# Patient Record
Sex: Female | Born: 1960 | Race: White | Hispanic: No | Smoking: Never smoker
Health system: Southern US, Community
[De-identification: ages and names within clinical notes are randomized; demographics above are authoritative.]

## PROBLEM LIST (undated history)

## (undated) DIAGNOSIS — K5790 Diverticulosis of intestine, part unspecified, without perforation or abscess without bleeding: Secondary | ICD-10-CM

## (undated) DIAGNOSIS — E240 Pituitary-dependent Cushing's disease: Secondary | ICD-10-CM

## (undated) DIAGNOSIS — K644 Residual hemorrhoidal skin tags: Secondary | ICD-10-CM

## (undated) DIAGNOSIS — K648 Other hemorrhoids: Secondary | ICD-10-CM

## (undated) DIAGNOSIS — G43909 Migraine, unspecified, not intractable, without status migrainosus: Secondary | ICD-10-CM

## (undated) DIAGNOSIS — K594 Anal spasm: Secondary | ICD-10-CM

## (undated) DIAGNOSIS — Z9889 Other specified postprocedural states: Secondary | ICD-10-CM

## (undated) DIAGNOSIS — R519 Headache, unspecified: Secondary | ICD-10-CM

## (undated) DIAGNOSIS — E785 Hyperlipidemia, unspecified: Secondary | ICD-10-CM

## (undated) DIAGNOSIS — M8580 Other specified disorders of bone density and structure, unspecified site: Secondary | ICD-10-CM

## (undated) DIAGNOSIS — T4145XA Adverse effect of unspecified anesthetic, initial encounter: Secondary | ICD-10-CM

## (undated) DIAGNOSIS — K219 Gastro-esophageal reflux disease without esophagitis: Secondary | ICD-10-CM

## (undated) DIAGNOSIS — R51 Headache: Secondary | ICD-10-CM

## (undated) DIAGNOSIS — T7840XA Allergy, unspecified, initial encounter: Secondary | ICD-10-CM

## (undated) DIAGNOSIS — K589 Irritable bowel syndrome without diarrhea: Secondary | ICD-10-CM

## (undated) DIAGNOSIS — I341 Nonrheumatic mitral (valve) prolapse: Secondary | ICD-10-CM

## (undated) DIAGNOSIS — R011 Cardiac murmur, unspecified: Secondary | ICD-10-CM

## (undated) DIAGNOSIS — F419 Anxiety disorder, unspecified: Secondary | ICD-10-CM

## (undated) DIAGNOSIS — Z86010 Personal history of colonic polyps: Secondary | ICD-10-CM

## (undated) DIAGNOSIS — R112 Nausea with vomiting, unspecified: Secondary | ICD-10-CM

## (undated) DIAGNOSIS — N200 Calculus of kidney: Secondary | ICD-10-CM

## (undated) DIAGNOSIS — T8859XA Other complications of anesthesia, initial encounter: Secondary | ICD-10-CM

## (undated) HISTORY — DX: Other hemorrhoids: K64.8

## (undated) HISTORY — PX: LITHOTRIPSY: SUR834

## (undated) HISTORY — DX: Hyperlipidemia, unspecified: E78.5

## (undated) HISTORY — DX: Anal spasm: K59.4

## (undated) HISTORY — DX: Residual hemorrhoidal skin tags: K64.4

## (undated) HISTORY — DX: Irritable bowel syndrome, unspecified: K58.9

## (undated) HISTORY — DX: Personal history of colonic polyps: Z86.010

## (undated) HISTORY — DX: Migraine, unspecified, not intractable, without status migrainosus: G43.909

## (undated) HISTORY — DX: Diverticulosis of intestine, part unspecified, without perforation or abscess without bleeding: K57.90

## (undated) HISTORY — DX: Other specified disorders of bone density and structure, unspecified site: M85.80

## (undated) HISTORY — PX: BREAST SURGERY: SHX581

## (undated) HISTORY — PX: ESOPHAGOGASTRODUODENOSCOPY: SHX1529

## (undated) HISTORY — DX: Allergy, unspecified, initial encounter: T78.40XA

---
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
## 1987-02-01 HISTORY — PX: OTHER SURGICAL HISTORY: SHX169

## 1987-02-01 HISTORY — PX: LAPAROSCOPIC ENDOMETRIOSIS FULGURATION: SUR769

## 2000-01-21 ENCOUNTER — Ambulatory Visit (HOSPITAL_COMMUNITY): Admission: RE | Admit: 2000-01-21 | Discharge: 2000-01-21 | Payer: Self-pay | Admitting: Endocrinology

## 2000-01-21 ENCOUNTER — Encounter: Payer: Self-pay | Admitting: Endocrinology

## 2001-12-17 ENCOUNTER — Other Ambulatory Visit: Admission: RE | Admit: 2001-12-17 | Discharge: 2001-12-17 | Payer: Self-pay | Admitting: Gynecology

## 2003-01-03 ENCOUNTER — Other Ambulatory Visit: Admission: RE | Admit: 2003-01-03 | Discharge: 2003-01-03 | Payer: Self-pay | Admitting: Gynecology

## 2004-03-31 ENCOUNTER — Other Ambulatory Visit: Admission: RE | Admit: 2004-03-31 | Discharge: 2004-03-31 | Payer: Self-pay | Admitting: Gynecology

## 2004-05-07 ENCOUNTER — Encounter: Admission: RE | Admit: 2004-05-07 | Discharge: 2004-05-07 | Payer: Self-pay | Admitting: Endocrinology

## 2005-12-19 ENCOUNTER — Other Ambulatory Visit: Admission: RE | Admit: 2005-12-19 | Discharge: 2005-12-19 | Payer: Self-pay | Admitting: Gynecology

## 2006-12-21 ENCOUNTER — Other Ambulatory Visit: Admission: RE | Admit: 2006-12-21 | Discharge: 2006-12-21 | Payer: Self-pay | Admitting: Gynecology

## 2007-12-31 ENCOUNTER — Other Ambulatory Visit: Admission: RE | Admit: 2007-12-31 | Discharge: 2007-12-31 | Payer: Self-pay | Admitting: Gynecology

## 2008-09-22 ENCOUNTER — Ambulatory Visit: Payer: Self-pay | Admitting: Radiology

## 2008-09-22 ENCOUNTER — Encounter: Payer: Self-pay | Admitting: Emergency Medicine

## 2008-09-22 ENCOUNTER — Inpatient Hospital Stay (HOSPITAL_COMMUNITY): Admission: AD | Admit: 2008-09-22 | Discharge: 2008-09-23 | Payer: Self-pay | Admitting: Internal Medicine

## 2009-12-28 ENCOUNTER — Emergency Department (HOSPITAL_BASED_OUTPATIENT_CLINIC_OR_DEPARTMENT_OTHER): Admission: EM | Admit: 2009-12-28 | Discharge: 2009-12-28 | Payer: Self-pay | Admitting: Emergency Medicine

## 2010-05-08 LAB — BASIC METABOLIC PANEL
BUN: 11 mg/dL (ref 6–23)
BUN: 13 mg/dL (ref 6–23)
BUN: 17 mg/dL (ref 6–23)
CO2: 29 mEq/L (ref 19–32)
Calcium: 9.9 mg/dL (ref 8.4–10.5)
Chloride: 105 mEq/L (ref 96–112)
Chloride: 106 mEq/L (ref 96–112)
Creatinine, Ser: 0.79 mg/dL (ref 0.4–1.2)
Creatinine, Ser: 0.8 mg/dL (ref 0.4–1.2)
GFR calc Af Amer: >60 mL/min (ref >60)
GFR calc Af Amer: >60 mL/min (ref >60)
GFR calc non Af Amer: >60 mL/min (ref >60)
GFR calc non Af Amer: >60 mL/min (ref >60)
Glucose, Bld: 84 mg/dL (ref 70–99)
Potassium: 3.2 mEq/L — ABNORMAL LOW (ref 3.5–5.1)
Potassium: 3.8 mEq/L (ref 3.5–5.1)
Sodium: 141 mEq/L (ref 135–145)
Sodium: 142 mEq/L (ref 135–145)

## 2010-05-08 LAB — CBC
HCT: 43.5 % (ref 36.0–46.0)
Hemoglobin: 14.9 g/dL (ref 12.0–15.0)
MCHC: 34.2 g/dL (ref 30.0–36.0)
MCV: 95.6 fL (ref 78.0–100.0)
Platelets: 265 10*3/uL (ref 150–400)
RBC: 4.55 MIL/uL (ref 3.87–5.11)
RDW: 12.7 % (ref 11.5–15.5)
WBC: 5.4 10*3/uL (ref 4.0–10.5)

## 2010-05-08 LAB — DIFFERENTIAL
Basophils Absolute: 0 10*3/uL (ref 0.0–0.1)
Basophils Relative: 1 % (ref 0–1)
Eosinophils Absolute: 0.1 10*3/uL (ref 0.0–0.7)
Eosinophils Relative: 2 % (ref 0–5)
Lymphocytes Relative: 23 % (ref 12–46)
Lymphs Abs: 1.3 10*3/uL (ref 0.7–4.0)
Monocytes Absolute: 0.5 10*3/uL (ref 0.1–1.0)
Monocytes Relative: 10 % (ref 3–12)
Neutro Abs: 3.5 10*3/uL (ref 1.7–7.7)
Neutrophils Relative %: 64 % (ref 43–77)

## 2010-05-08 LAB — POCT CARDIAC MARKERS
CKMB, poc: 1.2 ng/mL (ref 1.0–8.0)
Myoglobin, poc: 49.6 ng/mL (ref 12–200)
Troponin i, poc: <0.05 ng/mL (ref 0.00–0.09)

## 2010-05-08 LAB — CARDIAC PANEL(CRET KIN+CKTOT+MB+TROPI)
CK, MB: 1.5 ng/mL (ref 0.3–4.0)
Total CK: 71 U/L (ref 7–177)
Troponin I: 0.01 ng/mL (ref 0.00–0.06)
Troponin I: 0.02 ng/mL (ref 0.00–0.06)

## 2010-05-08 LAB — HEPATIC FUNCTION PANEL
Albumin: 3.7 g/dL (ref 3.5–5.2)
Total Protein: 6.6 g/dL (ref 6.0–8.3)

## 2010-05-08 LAB — AMYLASE: Amylase: 66 U/L (ref 27–131)

## 2010-05-08 LAB — D-DIMER, QUANTITATIVE: D-Dimer, Quant: <0.22 ug/mL-FEU (ref 0.00–0.48)

## 2010-05-08 LAB — LIPASE, BLOOD: Lipase: 39 U/L (ref 11–59)

## 2010-05-08 LAB — MAGNESIUM: Magnesium: 2 mg/dL (ref 1.5–2.5)

## 2010-05-08 LAB — LIPID PANEL
Cholesterol: 205 mg/dL — ABNORMAL HIGH (ref 0–200)
LDL Cholesterol: 138 mg/dL — ABNORMAL HIGH (ref 0–99)
Total CHOL/HDL Ratio: 3.4 RATIO

## 2010-05-08 LAB — TSH: TSH: 3.006 u[IU]/mL (ref 0.350–4.500)

## 2010-06-15 NOTE — H&P (Signed)
NAMEJIMYA, CIANI                ACCOUNT NO.:  [id number]   MEDICAL RECORD NO.:  [id number]          PATIENT TYPE:  INP   LOCATION:  3705                         FACILITY:  [facility]   PHYSICIAN:  Ramiro Harvest, MD    DATE OF BIRTH:  26-Sep-1960   DATE OF ADMISSION:  09/22/2008  DATE OF DISCHARGE:                              HISTORY & PHYSICAL   CHIEF COMPLAINT:  Chest pain.   HISTORY OF PRESENT ILLNESS:  Ms. Pippen is a 50 year old white female  who presented to the [facility] Emergency Department today after going  to an urgent care with complaint of chest pain.  She noted that  shortness of breath started on Saturday and was accompanied by general  malaise.  Today, she left work to get her lunch and developed left-sided  chest pain which radiated to her back and down her left arm which she  noted was transiently weak.  The [facility] ED recommended admission and  we are asked to admit her for further evaluation and treatment.   ALLERGIES:  CODEINE causes nausea and itching.   PAST MEDICAL HISTORY:  1. Hypothyroid.  2. Cushing syndrome.  3. Depression/anxiety.   PAST SURGICAL HISTORY:  1. Pituitary surgery 20 years ago.  2. Tonsillectomy.   SOCIAL HISTORY:  She is married.  She is a Emergency planning/management officer.  She has a  2 year old son who is in college.  She reports that she never smoked.  She does drink 3-4 caffeinated drinks a day.   FAMILY HISTORY:  Mother is living with hypertension and early dementia.  Father is living with history of diabetes type 2 and obesity.  She has 1  brother who is 21, recently diagnosed with heart murmur and 4 other  siblings who are alive and well.  The patient denies any family history  of premature coronary artery disease.   MEDICATIONS:  Takes Advil p.r.n. for a low back pain.   REVIEW OF SYSTEMS:  GENERAL:  No fever, no chills.  CARDIOVASCULAR:  Please see HPI.  RESPIRATORY:  No cough.  Did have shortness of breath,  see HPI.  GI:  Does note  positive nausea.  Denies blood in stool.  No  vomiting.  PSYCHIATRIC:  Denies depression or anxiety.  NEUROLOGIC:  Denies blurred vision.  MUSCULOSKELETAL:  Chronic low back pain at  baseline.  SKIN:  No rashes, no lesions.  HEME:  No bruising, no  bleeding.  ENT:  No sinus congestion, mild allergies.   LABORATORY/RADIOLOGY:  CK-MB 1.2, troponin less than 0.05, myoglobin  49.6, D-dimer less than 0.22.  Sodium 142, potassium 3.2, chloride 105,  bicarb 29, BUN 17, creatinine 0.9, glucose 84.  Hemoglobin 14.9,  hematocrit 43.5, white blood cell count 5.4, platelets 265.   PHYSICAL EXAMINATION:  VITALS:  Pending at the time of this dictation.  HEENT:  Head is normocephalic, atraumatic.  GENERAL:  Awake, alert, well-nourished female in no acute distress.  CARDIOVASCULAR:  S1, S2.  Regular rate and rhythm.  No lower extremity  edema.  RESPIRATORY:  Breath sounds are clear to auscultation bilaterally  without wheezes, rales, or rhonchi.  No increased work of breathing.  ABDOMEN:  Soft, nontender, nondistended.  Positive bowel sounds.  NEUROPSYCHIATRIC:  She is A and O x3.  She is calm and pleasant.  SKIN:  No rashes.  No lesions.   PROBLEMS:  1. Atypical chest pain, rule out myocardial infarction.  We will place      the patient on telemetry, cycle cardiac enzymes.  We will also      check LFTs and amylase and lipase to rule out pancreatitis or      cholecystitis.  Her D-dimer is negative.  We will add a proton pump      inhibitor for gastrointestinal prophylaxis.  I have recommended to      the patient that she discontinue nonsteroidal antiinflammatory drug      use and use Tylenol as needed back pain.  2. Hypothyroid, not currently on supplement.  We will check a TSH.  3. History of Cushing syndrome.  She had pituitary surgery 20 years      ago, clinically stable.  4. Depression/anxiety, stable per the patient.  5. Hypokalemia.  Replete oral, repeat BMET in morning.      Sandford Craze, NP      Ramiro Harvest, MD  Electronically Signed    MO/MEDQ  D:  09/22/2008  T:  09/23/2008  Job:  [id number]   cc:   Gretta Arab. Valentina Lucks, M.D.

## 2010-06-15 NOTE — Discharge Summary (Signed)
NAMEBATINA, DOUGAN                ACCOUNT NO.:  [id number]   MEDICAL RECORD NO.:  [id number]          PATIENT TYPE:  INP   LOCATION:  3705                         FACILITY:  [facility]   PHYSICIAN:  Kela Millin, M.D.DATE OF BIRTH:  October 31, 1960   DATE OF ADMISSION:  09/22/2008  DATE OF DISCHARGE:  09/23/2008                               DISCHARGE SUMMARY   DIAGNOSES AT TIME OF DISCHARGE:  1. Atypical chest pain.  2. Dyslipidemia.  3. Questionable gastroesophageal reflux disease.  4. History of hypothyroid.  5. History of Cushing syndrome.  6. Depression/anxiety.  7. Hypokalemia, resolved status post repletion.   HISTORY OF PRESENT ILLNESS:  Margaret Carney is a 50 year old female who was  admitted on September 22, 2008, with chief complaint of chest pain.  She  had some shortness of breath on Saturday which was associated with  general malaise.  On the day of admission, she left work to get her  lunch and then developed some left-sided chest pain, which she said that  radiated to her back, then on the left arm, which felt weak temporarily.  She had some mild associated shortness of breath.  She went to the  Urgent Care and was sent to the [facility] Emergency Department.  We  were asked to admit for further evaluation and treatment.   PAST MEDICAL HISTORY:  1. Hypothyroid.  2. Cushing syndrome.  3. Depression/anxiety.   COURSE OF HOSPITALIZATION:  1. Atypical chest pain.  The patient was admitted.  She underwent      serial cardiac enzymes which were negative x3.  She was monitored      on telemetry and maintained normal sinus rhythm.  She does note      that she has been taking large amounts of NSAIDs due to low back      pain and dysmenorrhea.  It was recommended to the patient that she      discontinue NSAIDs and use Tylenol p.r.n.  I have also recommended      to her that she take an over-the-counter Prilosec.  2. History of hypothyroid.  The patient had normal TSH during  this      admission.  3. Hypokalemia.  The patient's potassium was repleted orally.   MEDICATIONS AT TIME OF DISCHARGE:  1. Tylenol 650 mg p.o. q.6 h p.r.n.  2. Prilosec OTC 20 mg p.o. daily.   PERTINENT LABORATORIES AT TIME OF DISCHARGE:  Serial cardiac enzymes  negative x3.  Sodium 138, potassium 4.0, chloride 106, bicarb 28, BUN  11, creatinine 0.79, glucose 92.  Amylase/lipase normal.  AST, ALT  normal.  TSH 3.006.   DISPOSITION:  The patient will be discharged to home.  Followup per Dr.  Amil Amen of [facility], his office will arrange an outpatient  stress test and contact the patient with these results.  She will also  need to be scheduled for an outpatient echocardiogram.  She does note  history of mitral valve prolapse, will defer to the patient's primary  MD.   DIET:  She is instructed to start a low-cholesterol diet.  FOLLOW UP:  She is instructed to follow up with Dr. Maurice Small in 1  week and to follow up for an outpatient stress per Dr. Amil Amen' office.  She is instructed to contact the office as she does not hear from them  in regards to scheduled stress test.  She has been provided with the  office number.   She is instructed to return to the emergency department should she  develop recurrent shortness of breath or chest pain.      Sandford Craze, NP      Kela Millin, M.D.  Electronically Signed    MO/MEDQ  D:  09/23/2008  T:  09/24/2008  Job:  [id number]   cc:   Gretta Arab. Valentina Lucks, M.D.  Francisca December, M.D.

## 2011-01-18 ENCOUNTER — Encounter (INDEPENDENT_AMBULATORY_CARE_PROVIDER_SITE_OTHER): Payer: Self-pay | Admitting: Surgery

## 2011-01-19 ENCOUNTER — Encounter (INDEPENDENT_AMBULATORY_CARE_PROVIDER_SITE_OTHER): Payer: Self-pay | Admitting: Surgery

## 2011-02-01 DIAGNOSIS — K644 Residual hemorrhoidal skin tags: Secondary | ICD-10-CM | POA: Insufficient documentation

## 2011-02-01 DIAGNOSIS — Z860100 Personal history of colon polyps, unspecified: Secondary | ICD-10-CM

## 2011-02-01 DIAGNOSIS — K5790 Diverticulosis of intestine, part unspecified, without perforation or abscess without bleeding: Secondary | ICD-10-CM | POA: Insufficient documentation

## 2011-02-01 HISTORY — DX: Diverticulosis of intestine, part unspecified, without perforation or abscess without bleeding: K57.90

## 2011-02-01 HISTORY — PX: COLONOSCOPY W/ BIOPSIES: SHX1374

## 2011-02-01 HISTORY — DX: Residual hemorrhoidal skin tags: K64.4

## 2011-02-01 HISTORY — DX: Personal history of colon polyps, unspecified: Z86.0100

## 2011-02-01 HISTORY — DX: Personal history of colonic polyps: Z86.010

## 2011-04-11 ENCOUNTER — Other Ambulatory Visit: Payer: Self-pay | Admitting: Urology

## 2011-04-15 ENCOUNTER — Encounter (HOSPITAL_COMMUNITY): Payer: Self-pay | Admitting: Pharmacy Technician

## 2011-04-19 ENCOUNTER — Encounter (HOSPITAL_COMMUNITY): Payer: Self-pay | Admitting: *Deleted

## 2011-04-19 NOTE — Progress Notes (Signed)
Informed to arrive at 0530 on 03.21.2013  Remain npo after midnight  Absolutely no aspirin or ibuprofen products x 72 hours, prior to procedure  Bring driver and have adult stay c pt for 24 hours after procedure  Take laxative between 3 and 6 pm on evening prior to procedure and take only clears from then until 12 midnight  Bring completed blue folder,ins card and picture  ID   Denies  CPAP   Denies living will  [employer] understanding

## 2011-04-20 NOTE — H&P (Signed)
History of Present Illness     Followup microhematuria and back pain for CT and cystoscopy. She does report mild incontinence with coughing and sneezing and occasional urgency.  CT scan-I reviewed all the images- unofficially there is a 7 x 12 mm right lower pole stone and a 6 mm left midpole stone.  She has been well with no F/C or dysuria.   Past Medical History Problems  1. History of  Cushing's Syndrome 255.0 2. History of  Esophageal Reflux 530.81 3. History of  Murmurs 785.2  Surgical History Problems  1. History of  Incision Of Pituitary Gland 2. History of  Tonsillectomy  Allergies Medication  1. Codeine Derivatives  Family History Problems  1. Sororal history of  Arthritis V17.7 2. Sororal history of  Asthma V17.5 3. Sororal history of  Breast Cancer V16.3 4. Paternal history of  Diabetes Mellitus V18.0 5. Family history of  Family Health Status Number Of Children 1 son 6. Maternal history of  Hypertension V17.49 7. Fraternal history of  Nephrolithiasis  Social History Problems  1. Caffeine Use 3-4 per day 2. Marital History - Currently Married 3. 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 History of  Alcohol Use 6. History of  Tobacco Use 305.1  Review of Systems Constitutional, cardiovascular and pulmonary system(s) were reviewed and pertinent findings if present are noted.    Physical Exam Constitutional: Well nourished and well developed . No acute distress.  ENT:. The ears and nose are normal in appearance.  Neck: The appearance of the neck is normal and no neck mass is present.  Pulmonary: No respiratory distress and normal respiratory rhythm and effort.  Cardiovascular: Heart rate and rhythm are normal . No peripheral edema.  Abdomen: The abdomen is soft and nontender. No masses are palpated. No CVA tenderness. No hernias are palpable. No hepatosplenomegaly noted.  Genitourinary: Examination of the external genitalia shows normal  female external genitalia and no lesions. The urethra is normal in appearance and not tender.  Urethral hypermobility is present but no leakage with cough or strain. There is no urethral mass. Vaginal exam demonstrates no abnormalities.  Good pelvic floor contraction. The adnexa are palpably normal. The bladder is non tender, not distended and without masses. The anus is normal on inspection. The perineum is normal on inspection.  Lymphatics: The femoral and inguinal nodes are not enlarged or tender.  Skin: Normal skin turgor, no visible rash and no visible skin lesions.  Neuro/Psych:. Mood and affect are appropriate.    Procedure  Procedure: Cystoscopy  Indication: Hematuria. Lower Urinary Tract Symptoms.  Informed Consent: Risks, benefits, and potential adverse events were discussed and informed consent was obtained from the patient.  Prep: The patient was prepped with betadine.  Procedure Note:  Urethral meatus:. No abnormalities.  Anterior urethra: No abnormalities.  Bladder: Visulization was clear. The ureteral orifices were in the normal anatomic position bilaterally and had clear efflux of urine. A systematic survey of the bladder demonstrated no bladder tumors or stones. The mucosa was smooth without abnormalities. The patient tolerated the procedure well.  Complications: None.    Assessment Assessed  1. Urge And Stress Incontinence 788.33 2. Nephrolithiasis 592.0   Incontinence-good candidate for physical therapy or sling with urodynamics prior Nephrolithiasis, bilateral   Plan Urge And Stress Incontinence (788.33), Nephrolithiasis (592.0)  1. Follow-up Schedule Surgery Office  Follow-up  Requested for: 11Mar2013  Discussion/Summary  I discussed with the patient findings on her CT scan. We discussed  official read is pending. In regards to the right stone we discussed it is quite large and in the right lower pole. We discussed the nature risks and benefits of surveillance,  ureteroscopy and laser, extracorporeal shockwave lithotripsy or percutaneous nephrolithotomy. All questions answered. She elects to proceed with right shockwave lithotripsy. We also discussed the left kidney stent is approximately 6 mm and we will perform surveillance on this going forward and possible treatment in the future.

## 2011-04-21 ENCOUNTER — Encounter (HOSPITAL_COMMUNITY): Payer: Self-pay | Admitting: *Deleted

## 2011-04-21 ENCOUNTER — Ambulatory Visit (HOSPITAL_COMMUNITY)
Admission: RE | Admit: 2011-04-21 | Discharge: 2011-04-21 | Disposition: A | Discharge status: Home / Self Care | Payer: 59 | Source: Ambulatory Visit | Attending: Urology | Admitting: Urology

## 2011-04-21 ENCOUNTER — Ambulatory Visit (HOSPITAL_COMMUNITY): Payer: 59

## 2011-04-21 ENCOUNTER — Encounter (HOSPITAL_COMMUNITY)
Admission: RE | Disposition: A | Discharge status: Home / Self Care | Payer: Self-pay | Source: Ambulatory Visit | Attending: Urology

## 2011-04-21 DIAGNOSIS — Z5309 Procedure and treatment not carried out because of other contraindication: Secondary | ICD-10-CM | POA: Insufficient documentation

## 2011-04-21 DIAGNOSIS — N200 Calculus of kidney: Secondary | ICD-10-CM | POA: Insufficient documentation

## 2011-04-21 HISTORY — DX: Nonrheumatic mitral (valve) prolapse: I34.1

## 2011-04-21 HISTORY — DX: Calculus of kidney: N20.0

## 2011-04-21 SURGERY — LITHOTRIPSY, ESWL
Anesthesia: LOCAL | Laterality: Right

## 2011-04-21 MED ORDER — DEXTROSE-NACL 5-0.45 % IV SOLN
INTRAVENOUS | Status: DC
  Administered 2011-04-21: 07:00:00 via INTRAVENOUS

## 2011-04-21 MED ORDER — ONDANSETRON HCL 4 MG/2ML IJ SOLN
INTRAMUSCULAR | Status: AC
  Administered 2011-04-21: 4 mg via INTRAVENOUS
  Filled 2011-04-21: qty 2

## 2011-04-21 MED ORDER — CIPROFLOXACIN HCL 500 MG PO TABS: 500.0000 mg | ORAL_TABLET | ORAL | Status: DC

## 2011-04-21 MED ORDER — ONDANSETRON HCL 4 MG/2ML IJ SOLN
4.0000 mg | Freq: Once | INTRAMUSCULAR | Status: AC
  Administered 2011-04-21: 4 mg via INTRAVENOUS

## 2011-04-21 MED ORDER — DIPHENHYDRAMINE HCL 25 MG PO CAPS: 25.0000 mg | ORAL_CAPSULE | ORAL | Status: DC

## 2011-04-21 MED ORDER — DIAZEPAM 5 MG PO TABS: 10.0000 mg | ORAL_TABLET | ORAL | Status: DC

## 2011-04-21 NOTE — Progress Notes (Signed)
Dr. Mena Goes notified of positive serum pregnancy results.  Bjorn Loser on truck also notified.  Procedure has been cancelled and pt encouraged to call her gynecologist.  Pt verbalized understanding.

## 2011-04-21 NOTE — Progress Notes (Signed)
Pt is feeling better.  Zofran ordered and given.

## 2011-04-21 NOTE — Progress Notes (Signed)
Notified Dr. Mena Goes of pt's abnormal urine pregnancy test. Also informed him of patient c/o nausea all morning and pt had a vagal episode during IV insertion.  New orders given.

## 2011-05-03 ENCOUNTER — Ambulatory Visit
Admission: RE | Admit: 2011-05-03 | Discharge: 2011-05-03 | Disposition: A | Discharge status: Home / Self Care | Payer: 59 | Source: Ambulatory Visit | Attending: Family Medicine | Admitting: Family Medicine

## 2011-05-03 ENCOUNTER — Other Ambulatory Visit: Payer: Self-pay | Admitting: Family Medicine

## 2011-05-03 DIAGNOSIS — R0602 Shortness of breath: Secondary | ICD-10-CM

## 2011-05-06 ENCOUNTER — Encounter: Payer: Self-pay | Admitting: Internal Medicine

## 2011-05-06 ENCOUNTER — Ambulatory Visit (AMBULATORY_SURGERY_CENTER): Payer: 59 | Admitting: *Deleted

## 2011-05-06 VITALS — Ht 68.0 in | Wt 128.0 lb

## 2011-05-06 DIAGNOSIS — Z1211 Encounter for screening for malignant neoplasm of colon: Secondary | ICD-10-CM

## 2011-05-06 MED ORDER — PEG-KCL-NACL-NASULF-NA ASC-C 100 G PO SOLR: ORAL | Status: DC

## 2011-05-06 NOTE — Progress Notes (Signed)
Margaret Carney states she has a right kidney stone that will need to be lithotripsied but her PCP told her to have the colon screening first.  She states she had a colon about 6 years ago with Dr. Victorino Dike for IBS.  She's been c/o RUQ Pain, nausea and feeling full/decreased appetite.

## 2011-05-19 ENCOUNTER — Other Ambulatory Visit: Payer: Self-pay | Admitting: Urology

## 2011-05-20 ENCOUNTER — Ambulatory Visit (AMBULATORY_SURGERY_CENTER): Payer: 59 | Admitting: Internal Medicine

## 2011-05-20 ENCOUNTER — Encounter: Payer: Self-pay | Admitting: Internal Medicine

## 2011-05-20 VITALS — BP 138/75 | HR 80 | Temp 98.9°F | Resp 20 | Ht 68.0 in | Wt 128.0 lb

## 2011-05-20 DIAGNOSIS — Z1211 Encounter for screening for malignant neoplasm of colon: Secondary | ICD-10-CM

## 2011-05-20 DIAGNOSIS — K625 Hemorrhage of anus and rectum: Secondary | ICD-10-CM

## 2011-05-20 DIAGNOSIS — K5730 Diverticulosis of large intestine without perforation or abscess without bleeding: Secondary | ICD-10-CM

## 2011-05-20 DIAGNOSIS — D128 Benign neoplasm of rectum: Secondary | ICD-10-CM

## 2011-05-20 DIAGNOSIS — D129 Benign neoplasm of anus and anal canal: Secondary | ICD-10-CM

## 2011-05-20 DIAGNOSIS — K649 Unspecified hemorrhoids: Secondary | ICD-10-CM

## 2011-05-20 MED ORDER — SODIUM CHLORIDE 0.9 % IV SOLN: 500.0000 mL | INTRAVENOUS | Status: DC

## 2011-05-20 NOTE — Progress Notes (Signed)
Patient did not experience any of the following events: a burn prior to discharge; a fall within the facility; wrong site/side/patient/procedure/implant event; or a hospital transfer or hospital admission upon discharge from the facility. (G8907) Patient did not have preoperative order for IV antibiotic SSI prophylaxis. (G8918)  

## 2011-05-20 NOTE — Patient Instructions (Signed)

## 2011-05-20 NOTE — Op Note (Addendum)
[facility] [address] [place], [place]  [address]  COLONOSCOPY PROCEDURE REPORT  PATIENT:  Margaret Carney, Margaret Carney  MR#:  [id number] BIRTHDATE:  1960/12/08, 50 yrs. old  GENDER:  female ENDOSCOPIST:  Iva Boop, MD, [facility] REF. BY:  Teodora Medici, M.D. PROCEDURE DATE:  05/20/2011 PROCEDURE:  Colonoscopy with biopsy ASA CLASS:  Class I INDICATIONS:  Screening MEDICATIONS:   These medications were titrated to patient response per physician's verbal order, Fentanyl 75 mcg IV, Versed 8 mg IV  DESCRIPTION OF PROCEDURE:   After the risks benefits and alternatives of the procedure were thoroughly explained, informed consent was obtained.  Digital rectal exam was performed and revealed no abnormalities.   The LB CF-[id number] endoscope was introduced through the anus and advanced to the cecum, which was identified by both the appendix and ileocecal valve, without limitations and then into terminal ileum.  The quality of the prep was excellent, using MoviPrep.  The instrument was then slowly withdrawn as the colon was fully examined. <<PROCEDUREIMAGES>>  FINDINGS:  A diminutive polyp was found in the rectum. It was 2 mm in size. The polyp was removed using cold biopsy forceps. Abnormal appearing mucosa in the rectum. Looks like multiple superficial aphthous ulcers. Multiple biopsies were obtained and sent to pathology.  Mild diverticulosis was found in the sigmoid colon.  This was otherwise a normal examination of the colon and terminal ileum.   Retroflexed views in the rectum revealed internal and external hemorrhoids.    The time to cecum = 3:37 minutes. The scope was then withdrawn in 11:56 minutes from the cecum and the procedure completed. COMPLICATIONS:  None ENDOSCOPIC IMPRESSION: 1) 2 mm diminutive polyp in the rectum - removed 2) Abnormal mucosa in the rectum - ? aphthous ulcers/proctitis - biopsied 3) Mild diverticulosis in the sigmoid colon 4) Internal and  external hemorrhoids 5) Otherwise normal examination - excellent prep RECOMMENDATIONS: 1) Await biopsy results REPEAT EXAM:  In for Colonoscopy, pending biopsy results.  ADDENDUM: terminal ileum notations above added - normal terminal ileum  Iva Boop, MD, Clementeen Graham  CC:  Teodora Medici, MD, Maurice Small, MD and The Patient  n. REVISED:  05/20/2011 02:24 PM eSIGNED:   Iva Boop at 05/20/2011 02:24 PM  Theola Sequin, [id number]

## 2011-05-23 ENCOUNTER — Telehealth: Payer: Self-pay | Admitting: *Deleted

## 2011-05-23 NOTE — Telephone Encounter (Signed)
MESSAGE LEFT FOR THE PATIENT. 

## 2011-05-26 ENCOUNTER — Encounter: Payer: Self-pay | Admitting: Internal Medicine

## 2011-05-26 DIAGNOSIS — Z86010 Personal history of colonic polyps: Secondary | ICD-10-CM | POA: Insufficient documentation

## 2011-05-26 NOTE — Progress Notes (Signed)
Quick Note:  2mm rectal adenoma No proctitis Repeat colonoscopy 7 years 2020 ______

## 2011-06-08 ENCOUNTER — Encounter (HOSPITAL_COMMUNITY): Payer: Self-pay | Admitting: *Deleted

## 2011-06-08 NOTE — Progress Notes (Signed)
Spoke to patient via phone,history obtained,updated.  Bring blue folder,insurance cards,picture ID,designated driver and living will,POA, if desires (to be placed on chart). Reinforced no aspirin(instructions to hold aspirin per your doctor), ibuprofen products 72 hours prior to procedure. No vitamins or herbal medicines 7 days prior to procedure.   Follow laxative instructions provided by urologist (office) and in blue folder. Wear easy on/off clothing and no jewelry except wedding rings and ear rings. Leave all other valuables at home. Verbalizes understanding of instructions  This is same who had false positive pregnancy test in April and procedure had to be cancelled  States she has seen gynocologist and medical md , gone through a series of testing and this was said to be related to her cushings disease and "pre-menopausal" . She wants to be certain these problems wont occur on next visit, I will research what needs to be done and call her back   Pt verbalizes understanding.

## 2011-06-08 NOTE — Progress Notes (Signed)
Spoke c Games developer at [facility] re pts last visit and the (false positive) pregnancy test. She stated to have MD send letter to clear that this is a false positive and PT IS NOT PREGNANT, and pregnancy test here can be waived and procedure can be done  LM for pt to call back regarding this.

## 2011-06-10 NOTE — H&P (Addendum)
  History of Present Illness    Patient presented with microhematuria and back pain. CT scan-I reviewed all the images- unofficially there is a 7 x 12 mm right lower pole stone and a 6 mm left midpole stone.   She was scheduled for right extracorporal shockwave lithotripsy but her hCG was elevated. This was confirm by her gynecologist to be menopausal and she also had a negative pelvic ultrasound for pregnancy.  She returns today for right extracorporeal shockwave lithotripsy.  Past Medical History Problems  1. History of  Cushing's Syndrome 255.0 2. History of  Esophageal Reflux 530.81 3. History of  Murmurs 785.2  Surgical History Problems  1. History of  Incision Of Pituitary Gland 2. History of  Tonsillectomy  Allergies Medication  1. Codeine Derivatives  Family History Problems  1. Sororal history of  Arthritis V17.7 2. Sororal history of  Asthma V17.5 3. Sororal history of  Breast Cancer V16.3 4. Paternal history of  Diabetes Mellitus V18.0 5. Family history of  Family Health Status Number Of Children 1 son 6. Maternal history of  Hypertension V17.49 7. Fraternal history of  Nephrolithiasis  Social History Problems  1. Caffeine Use 3-4 per day 2. Marital History - Currently Married 3. 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 History of  Alcohol Use 6. History of  Tobacco Use 305.1  Review of Systems Constitutional, cardiovascular and pulmonary system(s) were reviewed and pertinent findings if present are noted.    Physical Exam Constitutional: Well nourished and well developed . No acute distress.  ENT:. The ears and nose are normal in appearance.  Neck: The appearance of the neck is normal and no neck mass is present.  Pulmonary: No respiratory distress and normal respiratory rhythm and effort.  Cardiovascular: Heart rate and rhythm are normal . No peripheral edema.  Abdomen: The abdomen is soft and nontender. No masses are palpated. No  CVA tenderness. No hernias are palpable. No hepatosplenomegaly noted.   Assessment Assessed  1. Urge And Stress Incontinence 788.33 2. Nephrolithiasis 592.0   Incontinence-good candidate for physical therapy or sling with urodynamics prior Nephrolithiasis, bilateral   Plan Urge And Stress Incontinence (788.33), Nephrolithiasis (592.0)  1. Follow-up Schedule Surgery Office  Follow-up  Requested for: 11Mar2013  Discussion/Summary We discussed again the RLP stone is quite large and in the right lower pole. We discussed the nature risks and benefits of surveillance, ureteroscopy and laser, extracorporeal shockwave lithotripsy or percutaneous nephrolithotomy. All questions answered. She elects to proceed with right shockwave lithotripsy. I reminded her the left kidney stent is approximately 6 mm and we will perform surveillance on this going forward and possible treatment in the future.  PE: AFVSS - Epic vitals reviewed NAD, A&Ox3 CV - RRR Lungs - reg depth, rate, efffort Abd - soft, NT Ext - no CCE  Addendum: Patient has had nausea and vomiting and not been able to eat or drink all day. She said Zofran and Phenergan. Also because she's not been able to take p.o. She got behind on her pain and required morphine IV. This resolved her pain. Given her extensive nausea and vomiting him to bring her in for overnight observation. A CBC and BMP will be sent.

## 2011-06-10 NOTE — Progress Notes (Signed)
Spoke c Oncologist at [facility]  She states per DR Renato Gails, Wellsite geologist of [facility], "Pt is cleared from having repeat pregnancy test, based on these faxed letters and results from dr Chevis Pretty and dr Valentina Lucks.  Also want to note this lady has refused a repeat urine pregnancy test as she states she has NOT  Had sexual intercourse since this began  Copies of  These letters are in Yellow chart for [facility]

## 2011-06-13 ENCOUNTER — Ambulatory Visit (HOSPITAL_COMMUNITY)
Admission: RE | Admit: 2011-06-13 | Discharge: 2011-06-14 | Disposition: A | Discharge status: Home / Self Care | Payer: 59 | Source: Ambulatory Visit | Attending: Urology | Admitting: Urology

## 2011-06-13 ENCOUNTER — Ambulatory Visit (HOSPITAL_COMMUNITY): Payer: 59

## 2011-06-13 ENCOUNTER — Encounter (HOSPITAL_COMMUNITY)
Admission: RE | Disposition: A | Discharge status: Home / Self Care | Payer: Self-pay | Source: Ambulatory Visit | Attending: Urology

## 2011-06-13 ENCOUNTER — Encounter (HOSPITAL_COMMUNITY): Payer: Self-pay | Admitting: *Deleted

## 2011-06-13 DIAGNOSIS — R011 Cardiac murmur, unspecified: Secondary | ICD-10-CM | POA: Insufficient documentation

## 2011-06-13 DIAGNOSIS — E249 Cushing's syndrome, unspecified: Secondary | ICD-10-CM | POA: Insufficient documentation

## 2011-06-13 DIAGNOSIS — R112 Nausea with vomiting, unspecified: Secondary | ICD-10-CM | POA: Insufficient documentation

## 2011-06-13 DIAGNOSIS — K219 Gastro-esophageal reflux disease without esophagitis: Secondary | ICD-10-CM | POA: Insufficient documentation

## 2011-06-13 DIAGNOSIS — N3946 Mixed incontinence: Secondary | ICD-10-CM | POA: Insufficient documentation

## 2011-06-13 DIAGNOSIS — N200 Calculus of kidney: Secondary | ICD-10-CM | POA: Insufficient documentation

## 2011-06-13 HISTORY — DX: Cardiac murmur, unspecified: R01.1

## 2011-06-13 HISTORY — DX: Gastro-esophageal reflux disease without esophagitis: K21.9

## 2011-06-13 LAB — CBC
HCT: 40.2 % (ref 36.0–46.0)
Hemoglobin: 13.7 g/dL (ref 12.0–15.0)
MCH: 31.6 pg (ref 26.0–34.0)
MCV: 92.8 fL (ref 78.0–100.0)
Platelets: 234 10*3/uL (ref 150–400)
RBC: 4.33 MIL/uL (ref 3.87–5.11)
WBC: 9.6 10*3/uL (ref 4.0–10.5)

## 2011-06-13 LAB — BASIC METABOLIC PANEL
CO2: 28 mEq/L (ref 19–32)
Chloride: 107 mEq/L (ref 96–112)
Creatinine, Ser: 0.79 mg/dL (ref 0.50–1.10)
Glucose, Bld: 107 mg/dL — ABNORMAL HIGH (ref 70–99)

## 2011-06-13 SURGERY — LITHOTRIPSY, ESWL
Anesthesia: LOCAL | Laterality: Right

## 2011-06-13 MED ORDER — SODIUM CHLORIDE 0.9 % IV SOLN: 250.0000 mL | INTRAVENOUS | Status: DC

## 2011-06-13 MED ORDER — ONDANSETRON HCL 4 MG/2ML IJ SOLN
4.0000 mg | Freq: Once | INTRAMUSCULAR | Status: AC
  Administered 2011-06-13: 4 mg via INTRAVENOUS

## 2011-06-13 MED ORDER — DEXTROSE-NACL 5-0.45 % IV SOLN
INTRAVENOUS | Status: DC
  Administered 2011-06-13 (×2): via INTRAVENOUS

## 2011-06-13 MED ORDER — CIPROFLOXACIN HCL 500 MG PO TABS
ORAL_TABLET | ORAL | Status: AC
  Administered 2011-06-13: 500 mg via ORAL
  Filled 2011-06-13: qty 1

## 2011-06-13 MED ORDER — DOCUSATE SODIUM 100 MG PO CAPS
100.0000 mg | ORAL_CAPSULE | Freq: Two times a day (BID) | ORAL | Status: DC
  Administered 2011-06-13: 100 mg via ORAL
  Filled 2011-06-13 (×3): qty 1

## 2011-06-13 MED ORDER — SODIUM CHLORIDE 0.9 % IJ SOLN: 3.0000 mL | Freq: Two times a day (BID) | INTRAMUSCULAR | Status: DC

## 2011-06-13 MED ORDER — PROMETHAZINE HCL 25 MG/ML IJ SOLN
6.2500 mg | Freq: Once | INTRAMUSCULAR | Status: AC
  Administered 2011-06-13: 6.25 mg via INTRAVENOUS

## 2011-06-13 MED ORDER — SODIUM CHLORIDE 0.9 % IJ SOLN: 3.0000 mL | INTRAMUSCULAR | Status: DC | PRN

## 2011-06-13 MED ORDER — DIAZEPAM 5 MG PO TABS
10.0000 mg | ORAL_TABLET | ORAL | Status: AC
  Administered 2011-06-13: 10 mg via ORAL

## 2011-06-13 MED ORDER — CIPROFLOXACIN HCL 250 MG PO TABS: 250.0000 mg | ORAL_TABLET | Freq: Two times a day (BID) | ORAL | Status: AC

## 2011-06-13 MED ORDER — CIPROFLOXACIN HCL 250 MG PO TABS
250.0000 mg | ORAL_TABLET | Freq: Two times a day (BID) | ORAL | Status: DC
  Administered 2011-06-13 – 2011-06-14 (×2): 250 mg via ORAL
  Filled 2011-06-13 (×4): qty 1

## 2011-06-13 MED ORDER — ONDANSETRON HCL 4 MG/2ML IJ SOLN
INTRAMUSCULAR | Status: AC
  Administered 2011-06-13: 4 mg via INTRAVENOUS
  Filled 2011-06-13: qty 2

## 2011-06-13 MED ORDER — ZOLPIDEM TARTRATE 5 MG PO TABS: 5.0000 mg | ORAL_TABLET | Freq: Every evening | ORAL | Status: DC | PRN

## 2011-06-13 MED ORDER — CIPROFLOXACIN HCL 500 MG PO TABS
500.0000 mg | ORAL_TABLET | ORAL | Status: AC
  Administered 2011-06-13: 500 mg via ORAL

## 2011-06-13 MED ORDER — MORPHINE SULFATE 2 MG/ML IJ SOLN
INTRAMUSCULAR | Status: AC
  Administered 2011-06-13: 2 mg via INTRAVENOUS
  Filled 2011-06-13: qty 1

## 2011-06-13 MED ORDER — PROMETHAZINE HCL 25 MG/ML IJ SOLN
INTRAMUSCULAR | Status: AC
  Administered 2011-06-13: 6.25 mg via INTRAVENOUS
  Filled 2011-06-13: qty 1

## 2011-06-13 MED ORDER — DIPHENHYDRAMINE HCL 25 MG PO CAPS
25.0000 mg | ORAL_CAPSULE | ORAL | Status: AC
  Administered 2011-06-13: 25 mg via ORAL

## 2011-06-13 MED ORDER — KCL IN DEXTROSE-NACL 20-5-0.45 MEQ/L-%-% IV SOLN
INTRAVENOUS | Status: DC
  Administered 2011-06-13 – 2011-06-14 (×2): via INTRAVENOUS
  Filled 2011-06-13 (×3): qty 1000

## 2011-06-13 MED ORDER — PROMETHAZINE HCL 25 MG/ML IJ SOLN: 6.2500 mg | INTRAMUSCULAR | Status: DC | PRN

## 2011-06-13 MED ORDER — ACETAMINOPHEN 325 MG PO TABS
650.0000 mg | ORAL_TABLET | ORAL | Status: DC | PRN
  Administered 2011-06-14: 650 mg via ORAL
  Filled 2011-06-13: qty 2

## 2011-06-13 MED ORDER — ONDANSETRON HCL 4 MG/2ML IJ SOLN
4.0000 mg | INTRAMUSCULAR | Status: DC | PRN
  Administered 2011-06-13 (×2): 4 mg via INTRAVENOUS
  Filled 2011-06-13: qty 2

## 2011-06-13 MED ORDER — MORPHINE SULFATE 2 MG/ML IJ SOLN: 2.0000 mg | INTRAMUSCULAR | Status: DC | PRN

## 2011-06-13 MED ORDER — DIAZEPAM 5 MG PO TABS
ORAL_TABLET | ORAL | Status: AC
  Administered 2011-06-13: 10 mg via ORAL
  Filled 2011-06-13: qty 2

## 2011-06-13 MED ORDER — IBUPROFEN 200 MG PO TABS: 400.0000 mg | ORAL_TABLET | Freq: Four times a day (QID) | ORAL | Status: DC | PRN

## 2011-06-13 MED ORDER — TAMSULOSIN HCL 0.4 MG PO CAPS: 0.4000 mg | ORAL_CAPSULE | Freq: Every day | ORAL | Status: DC

## 2011-06-13 MED ORDER — HYOSCYAMINE SULFATE 0.125 MG PO TABS
0.1250 mg | ORAL_TABLET | Freq: Four times a day (QID) | ORAL | Status: DC | PRN
  Filled 2011-06-13: qty 1

## 2011-06-13 MED ORDER — DIPHENHYDRAMINE HCL 25 MG PO CAPS
ORAL_CAPSULE | ORAL | Status: AC
  Administered 2011-06-13: 25 mg via ORAL
  Filled 2011-06-13: qty 1

## 2011-06-13 MED ORDER — HYDROCODONE-ACETAMINOPHEN 5-325 MG PO TABS
1.0000 | ORAL_TABLET | ORAL | Status: DC | PRN
  Administered 2011-06-13 – 2011-06-14 (×3): 1 via ORAL
  Filled 2011-06-13 (×3): qty 1

## 2011-06-13 MED ORDER — OXYCODONE-ACETAMINOPHEN 5-325 MG PO TABS: 1.0000 | ORAL_TABLET | ORAL | Status: AC | PRN

## 2011-06-13 MED ORDER — MORPHINE SULFATE 2 MG/ML IJ SOLN: 2.0000 mg | Freq: Once | INTRAMUSCULAR | Status: DC

## 2011-06-13 NOTE — Progress Notes (Signed)
Patient crying in pain. States pain 10/10. Sobbing. Dry heaving. MD called and he is going to put in admitting orders. Order for morphine obtained now and will give immediately.

## 2011-06-13 NOTE — Brief Op Note (Signed)
06/13/2011  9:04 AM  PATIENT:  Margaret Carney  51 y.o. female  PRE-OPERATIVE DIAGNOSIS:  Right Nephrolithiasis  POST-OPERATIVE DIAGNOSIS:  same  PROCEDURE:  Procedure(s) (LRB): EXTRACORPOREAL SHOCK WAVE LITHOTRIPSY (ESWL) (Right)  SURGEON:  Surgeon(s) and Role:    * Antony Haste, MD - Primary  ANESTHESIA: IV sedation  EBL:   zero  DICTATION: scanned op note  PLAN OF CARE: Discharge to home after PACU  PATIENT DISPOSITION:  PACU - hemodynamically stable.   Delay start of Pharmacological VTE agent (>24hrs) due to surgical blood loss or risk of bleeding: not applicable

## 2011-06-13 NOTE — Discharge Instructions (Signed)
Lithotripsy Care After Refer to this sheet for the next few weeks. These discharge instructions provide you with general information on caring for yourself after you leave the hospital. Your caregiver may also give you specific instructions. Your treatment has been planned according to the most current medical practices available, but unavoidable complications sometimes occur. If you have any problems or questions after discharge, please call your caregiver. AFTER THE PROCEDURE   The recovery time will vary with the procedure done.   You will be taken to the recovery area. A nurse will watch and check your progress. Once you are awake, stable, and taking fluids well, you will be allowed to go home as long as there are no problems.   Your urine may have a red tinge for a few days after treatment. Blood loss is usually minimal.   You may have soreness in the back or flank area. This usually goes away after a few days. The procedure can cause blotches or bruises on the back where the pressure wave enters the skin. These marks usually cause only minimal discomfort and should disappear in a short time.   Stone fragments should begin to pass within 24 hours of treatment. However, a delayed passage is not unusual.   You may have pain, discomfort, and feel sick to your stomach (nauseous) when the crushed (pulverized) fragments of stone are passed down the tube from the kidney to the bladder. Stone fragments can pass soon after the procedure and may last for up to 4 to 8 weeks.   A small number of patients may have severe pain when stone fragments are not able to pass, which leads to an obstruction.   If your stone is greater than 1 inch/2.5 centimeters in diameter or if you have multiple stones that have a combined diameter greater than 1 inch/2.5 centimeters, you may require more than 1 treatment.   You must have someone drive you home.  HOME CARE INSTRUCTIONS   Rest at home until you feel your  energy improving.   Only take over-the-counter or prescription medicines for pain, discomfort, or fever as directed by your caregiver. Depending on the type of lithotripsy, you may need to take medicines (antibiotics) that kill germs and anti-inflammatory medicines for a few days.   Drink enough water and fluids to keep your urine clear or pale yellow. This helps "flush" your kidneys. It helps pass any remaining pieces of stone and prevents stones from coming back.   Most people can resume daily activities within 1 or 2 days after standard lithotripsy. It can take longer to recover from laser and percutaneous lithotripsy.   If the stones are in your urinary system, you may be asked to strain your urine at home to look for stones. Any stones that are found can be sent to a medical lab for examination.   Visit your caregiver for a follow-up appointment in a few weeks. Your doctor may remove your stent if you have one. Your caregiver will also check to see whether stone particles still remain.  SEEK MEDICAL CARE IF:   You have an oral temperature above 102 F (38.9 C).   Your pain is not relieved by medicine.   You have a lasting nauseous feeling.   You feel there is too much blood in the urine.   You develop persistent problems with frequent and/or painful urination that does not at least partially improve after 2 days following the procedure.   You have a congested   cough.   You feel lightheaded.   You develop a rash or any other signs that might suggest an allergic problem.   You develop any reaction or side effects to your medicine(s).  SEEK IMMEDIATE MEDICAL CARE IF:   You experience severe back and/or flank pain.   You see nothing but blood when you urinate.   You cannot pass any urine at all.   You have an oral temperature above 102 F (38.9 C), not controlled by medicine.   You develop shortness of breath, difficulty breathing, or chest pain.   You develop vomiting  that will not stop after 6 to 8 hours.   You have a fainting episode.  MAKE SURE YOU:   Understand these instructions.   Will watch your condition.   Will get help right away if you are not doing well or get worse.  Document Released: 02/06/2007 Document Revised: 01/06/2011 Document Reviewed: 02/06/2007 Exit[employer] Patient Information 2012 [employer]. 

## 2011-06-13 NOTE — Progress Notes (Signed)
Patient still is vomiting small amounts and feeling very nauseated if moves. She feels "okay" if lying back in recliner. Requests additional nausea meds. MD paged.

## 2011-06-13 NOTE — Progress Notes (Signed)
Spoke to Dr. Mena Goes and order received.

## 2011-06-13 NOTE — Progress Notes (Signed)
Patient's sisters have already been to outpatient pharmacy and filled prescriptions for discharge. Discharge papers from litho still in chart and will inform next RN (when patient transferred to inpatient room) they are with chart to review with patient at d/c.

## 2011-06-13 NOTE — Progress Notes (Signed)
Patient MUCH more comfortable after iv Morphine. Patient and family both stated they are not comfortable taking patient home with severe nausea as she is unable to keep anything down. MD placing admit orders. Spoke to bed control and they are looking for a bed for Margaret Carney. She has been up to urinate at 1400 and voided qs post litho. Continue to monitor pain and nausea.

## 2011-06-13 NOTE — Progress Notes (Signed)
Patient took laxative yesterday. Denies blood thinners or aspirin products.

## 2011-06-13 NOTE — Progress Notes (Signed)
Patient received from litho truck at 1010 feeling nauseated and vomiting small amount of clear fluid. Zofran given by this RN pre litho and also on the [employer] truck post [employer] per [employer]. Currently patient resting in short stay and sisters are at bedside. VSS. IV fluid running. Continue to closely monitor.

## 2011-06-13 NOTE — Progress Notes (Signed)
Patient stated she gets nauseated taking meds on an empty stomach and requested meds. Called Dr. Mena Goes and order received.

## 2011-06-13 NOTE — Progress Notes (Signed)
Patient still with nausea and vomiting. Has vomited crackers/water she has had. States this is normal for her post op as she does not tolerate meds in her system well. Has had the Zofran 4mg  pre and post litho. Had the Phenergan 6.25mg  at 1300 and repeat at 1500. States she has a few Zofran sublingual at home to get her through until tomorrow. Will call Dr. Mena Goes and inform of above.

## 2011-06-13 NOTE — Progress Notes (Signed)
Assisted patient up to the bathroom. Has now voided twice post litho. Feels MUCH better after morphine though has not been able to keep any fluids down. Dr. Mena Goes has entered all the admitting orders. Called and spoke to [facility] and will now transfer patient to 5W.

## 2011-06-14 ENCOUNTER — Encounter (HOSPITAL_COMMUNITY): Payer: Self-pay | Admitting: *Deleted

## 2011-06-14 MED ORDER — PROMETHAZINE HCL 12.5 MG PO TABS: 6.2500 mg | ORAL_TABLET | Freq: Three times a day (TID) | ORAL | Status: DC | PRN

## 2011-06-14 NOTE — Discharge Summary (Signed)
Physician Discharge Summary  Patient ID: Margaret Carney MRN: [id number] DOB/AGE: [birth year] 50 y.o.  Admit date: 06/13/2011 Discharge date: 06/14/2011  Admission Diagnoses: nephrolithiasis, nausea, emesis  Discharge Diagnoses:  nephrolithiasis, nausea, emesis  Discharged Condition: good  Hospital Course: Patient admitted post-ESWL with nausea/emesis and unable to tale po pain meds. She did well overnight with IV fluids. Her pain is minimal today. No nausea this AM. A CBC and BMP were normal.   Consults: None  Significant Diagnostic Studies: none  Treatments: surgery: right ESWL  Discharge Exam: Blood pressure 111/70, pulse 59, temperature 98.1 F (36.7 C), temperature source Oral, resp. rate 15, height 5\' 8"  (1.727 m), weight 55.906 kg (123 lb 4 oz), SpO2 99.00%. NAD Watching V, A&Ox3 No CVAT  Disposition: 01-Home or Self Care   Medication List  As of 06/14/2011  8:48 AM   TAKE these medications         ciprofloxacin 250 MG tablet   Commonly known as: CIPRO   Take 1 tablet (250 mg total) by mouth 2 (two) times daily.      hyoscyamine 0.125 MG tablet   Commonly known as: LEVSIN, ANASPAZ   Take 1 tablet by mouth every 6 (six) hours as needed. For Abdominal pain      ibuprofen 200 MG tablet   Commonly known as: ADVIL,MOTRIN   Take 2 tablets (400 mg total) by mouth every 6 (six) hours as needed. For pain   Start taking on: 06/16/2011      ondansetron 4 MG disintegrating tablet   Commonly known as: ZOFRAN-ODT   Take 1 tablet by mouth every 8 (eight) hours as needed. For nausea      oxyCODONE-acetaminophen 5-325 MG per tablet   Commonly known as: PERCOCET   Take 1 tablet by mouth every 4 (four) hours as needed for pain.      polyethylene glycol packet   Commonly known as: MIRALAX / GLYCOLAX   Take 17 g by mouth daily with breakfast.      promethazine 12.5 MG tablet   Commonly known as: PHENERGAN   Take 0.5 tablets (6.25 mg total) by mouth every 8 (eight) hours as  needed for nausea.      Tamsulosin HCl 0.4 MG Caps   Commonly known as: FLOMAX   Take 1 capsule (0.4 mg total) by mouth daily after supper.           Follow-up Information    Follow up with [facility], NP.   Contact information:   [address] [facility] [address] [facility] [place] [address] [phone]          Signed: Antony Haste 06/14/2011, 8:48 AM

## 2011-06-14 NOTE — Progress Notes (Signed)
Pt voided 300 cc red urine.  Strained a few granules of what looks like a stone broken up.  Placed in cup in bathroom.   Will continue to monitor.

## 2011-06-15 ENCOUNTER — Encounter (HOSPITAL_BASED_OUTPATIENT_CLINIC_OR_DEPARTMENT_OTHER): Payer: Self-pay | Admitting: *Deleted

## 2011-06-15 ENCOUNTER — Emergency Department (HOSPITAL_BASED_OUTPATIENT_CLINIC_OR_DEPARTMENT_OTHER)
Admission: EM | Admit: 2011-06-15 | Discharge: 2011-06-15 | Disposition: A | Discharge status: Home / Self Care | Payer: 59 | Attending: Emergency Medicine | Admitting: Emergency Medicine

## 2011-06-15 ENCOUNTER — Emergency Department (HOSPITAL_BASED_OUTPATIENT_CLINIC_OR_DEPARTMENT_OTHER): Payer: 59

## 2011-06-15 DIAGNOSIS — R112 Nausea with vomiting, unspecified: Secondary | ICD-10-CM | POA: Insufficient documentation

## 2011-06-15 DIAGNOSIS — N200 Calculus of kidney: Secondary | ICD-10-CM | POA: Insufficient documentation

## 2011-06-15 DIAGNOSIS — Z9889 Other specified postprocedural states: Secondary | ICD-10-CM | POA: Insufficient documentation

## 2011-06-15 DIAGNOSIS — R109 Unspecified abdominal pain: Secondary | ICD-10-CM | POA: Insufficient documentation

## 2011-06-15 DIAGNOSIS — G8918 Other acute postprocedural pain: Secondary | ICD-10-CM | POA: Insufficient documentation

## 2011-06-15 LAB — DIFFERENTIAL
Basophils Absolute: 0 10*3/uL (ref 0.0–0.1)
Eosinophils Absolute: 0 10*3/uL (ref 0.0–0.7)
Eosinophils Relative: 0 % (ref 0–5)
Lymphocytes Relative: 7 % — ABNORMAL LOW (ref 12–46)
Lymphs Abs: 0.8 10*3/uL (ref 0.7–4.0)
Neutrophils Relative %: 86 % — ABNORMAL HIGH (ref 43–77)

## 2011-06-15 LAB — URINE MICROSCOPIC-ADD ON

## 2011-06-15 LAB — CBC
MCH: 32 pg (ref 26.0–34.0)
Platelets: 231 10*3/uL (ref 150–400)
RBC: 4.41 MIL/uL (ref 3.87–5.11)
RDW: 13.5 % (ref 11.5–15.5)
WBC: 11.3 10*3/uL — ABNORMAL HIGH (ref 4.0–10.5)

## 2011-06-15 LAB — URINALYSIS, ROUTINE W REFLEX MICROSCOPIC
Nitrite: NEGATIVE
Protein, ur: 30 mg/dL — AB
Specific Gravity, Urine: 1.024 (ref 1.005–1.030)
Urobilinogen, UA: 0.2 mg/dL (ref 0.0–1.0)

## 2011-06-15 LAB — BASIC METABOLIC PANEL
Calcium: 10.1 mg/dL (ref 8.4–10.5)
GFR calc non Af Amer: 58 mL/min — ABNORMAL LOW (ref >90)
Glucose, Bld: 109 mg/dL — ABNORMAL HIGH (ref 70–99)
Potassium: 3.4 mEq/L — ABNORMAL LOW (ref 3.5–5.1)
Sodium: 143 mEq/L (ref 135–145)

## 2011-06-15 MED ORDER — FENTANYL CITRATE 0.05 MG/ML IJ SOLN
100.0000 ug | Freq: Once | INTRAMUSCULAR | Status: AC
  Administered 2011-06-15: 100 ug via INTRAVENOUS
  Filled 2011-06-15: qty 2

## 2011-06-15 MED ORDER — PROMETHAZINE HCL 25 MG/ML IJ SOLN
25.0000 mg | Freq: Once | INTRAMUSCULAR | Status: AC
  Administered 2011-06-15: 25 mg via INTRAVENOUS
  Filled 2011-06-15: qty 1

## 2011-06-15 MED ORDER — ONDANSETRON HCL 4 MG/2ML IJ SOLN
4.0000 mg | Freq: Once | INTRAMUSCULAR | Status: AC
  Administered 2011-06-15: 4 mg via INTRAVENOUS

## 2011-06-15 MED ORDER — KETOROLAC TROMETHAMINE 30 MG/ML IJ SOLN
30.0000 mg | Freq: Once | INTRAMUSCULAR | Status: AC
  Administered 2011-06-15: 30 mg via INTRAVENOUS
  Filled 2011-06-15: qty 1

## 2011-06-15 MED ORDER — SODIUM CHLORIDE 0.9 % IV SOLN
Freq: Once | INTRAVENOUS | Status: AC
  Administered 2011-06-15: 1000 mL via INTRAVENOUS

## 2011-06-15 MED ORDER — HYDROMORPHONE HCL PF 1 MG/ML IJ SOLN
1.0000 mg | Freq: Once | INTRAMUSCULAR | Status: AC
  Administered 2011-06-15: 1 mg via INTRAVENOUS
  Filled 2011-06-15: qty 1

## 2011-06-15 MED ORDER — ONDANSETRON HCL 4 MG/2ML IJ SOLN
INTRAMUSCULAR | Status: AC
  Filled 2011-06-15: qty 2

## 2011-06-15 NOTE — ED Notes (Signed)
Hx kidney stones    Had lithotripsy on 5/13  Was admitted that night because of nausea ,vomiting and pain.  Went home am of 5/14.  Has continued to vomit and have pain 10/10

## 2011-06-15 NOTE — Discharge Instructions (Signed)

## 2011-06-15 NOTE — ED Notes (Signed)
Pt's sister returned to facility after pt was discharged c/o pt care and the lack of information the sister was provided during pt's treatment and upon pt's discharge. Informed pt's sister that it was against our policy to discuss pt information with anyone other than the patient unless we were given verbal permission. Pt's sister was in room during some of pt's care but then decided to go sleep in another room and informed staff to come wake her if patient needed her. Pt never asked for staff to go get sister or express concerns regarding her treatment or plan of care. Informed pt's sister that pt was alert, oriented, and was able to verbalize discharge plan and follow-up care to this nurse and Dr Nicanor Alcon. Pt was alert and oriented x4, NAD noted, and was d/c'd home in sister's care. Pt's sister was at bs during d/c and was asked if either of them had any questions regarding her follow-up. No questions were asked and pt was d/c'd home to follow-up with urology today per on-call urologist request.

## 2011-06-15 NOTE — ED Provider Notes (Addendum)
History     CSN: [id number]  Arrival date & time 06/15/11  0043   First MD Initiated Contact with Patient 06/15/11 0054      Chief Complaint  Patient presents with  . Nephrolithiasis    (Consider location/radiation/quality/duration/timing/severity/associated sxs/prior treatment) Patient is a 51 y.o. female presenting with flank pain. The history is provided by the patient and a relative. No language interpreter was used.  Flank Pain This is a chronic problem. The current episode started more than 1 week ago. The problem occurs constantly. The problem has not changed since onset.Associated symptoms include abdominal pain. The symptoms are aggravated by nothing. The symptoms are relieved by nothing. Treatments tried: narcotics pain medications and phenergan. The treatment provided no relief.  Patient had lithotripsy 5/13 and then reported intractable n/v/ pain and was admitted Monday at [facility].  States she was discharged on Tuesday 5/14 with persistent symptoms.  Per report of family member and patient family member spoke with a female on call for [facility] who advised warm shower for continued symptoms and if no improvement to return to the ED.    Past Medical History  Diagnosis Date  . MVP (mitral valve prolapse)   . Kidney stone on right side   . Allergy     seasonal  . Osteopenia   . Heart murmur   . GERD (gastroesophageal reflux disease)     Past Surgical History  Procedure Date  . Cushings   . Pitituary tumor     1989  . Laparoscopic endometriosis fulguration 1989  . Tonsillectomy and adenoidectomy 1968  . Lithotripsy     Family History  Problem Relation Age of Onset  . Colon polyps Mother   . Colon cancer Neg Hx   . Stomach cancer Neg Hx   . Ulcerative colitis Neg Hx   . Esophageal cancer Neg Hx     History  Substance Use Topics  . Smoking status: Never Smoker   . Smokeless tobacco: Never Used  . Alcohol Use: No    OB History    Grav Para  Term Preterm Abortions TAB SAB Ect Mult Living                  Review of Systems  Gastrointestinal: Positive for nausea, vomiting and abdominal pain.  Genitourinary: Positive for flank pain.  All other systems reviewed and are negative.    Allergies  Codeine  Home Medications   Current Outpatient Rx  Name Route Sig Dispense Refill  . CIPROFLOXACIN HCL 250 MG PO TABS Oral Take 1 tablet (250 mg total) by mouth 2 (two) times daily. 6 tablet 0  . HYOSCYAMINE SULFATE 0.125 MG PO TABS Oral Take 1 tablet by mouth every 6 (six) hours as needed. For Abdominal pain    . IBUPROFEN 200 MG PO TABS Oral Take 2 tablets (400 mg total) by mouth every 6 (six) hours as needed. For pain 30 tablet 0  . ONDANSETRON 4 MG PO TBDP Oral Take 1 tablet by mouth every 8 (eight) hours as needed. For nausea    . OXYCODONE-ACETAMINOPHEN 5-325 MG PO TABS Oral Take 1 tablet by mouth every 4 (four) hours as needed for pain. 30 tablet 0  . POLYETHYLENE GLYCOL 3350 PO PACK Oral Take 17 g by mouth daily with breakfast.     . PROMETHAZINE HCL 12.5 MG PO TABS Oral Take 0.5 tablets (6.25 mg total) by mouth every 8 (eight) hours as needed for nausea. 30 tablet 0  .  TAMSULOSIN HCL 0.4 MG PO CAPS Oral Take 1 capsule (0.4 mg total) by mouth daily after supper. 30 capsule 0    BP 120/67  Pulse 87  Resp 18  Ht 5\' 8"  (1.727 m)  Wt 125 lb (56.7 kg)  BMI 19.01 kg/m2  SpO2 97%  LMP 09/08/2010  Physical Exam  Constitutional: She appears well-developed and well-nourished.  HENT:  Head: Normocephalic and atraumatic.       Tacky mucus membranes  Eyes: Conjunctivae are normal. Pupils are equal, round, and reactive to light.  Neck: Normal range of motion. Neck supple.  Cardiovascular: Normal rate and regular rhythm.   Pulmonary/Chest: Effort normal and breath sounds normal. She has no wheezes. She has no rales.  Abdominal: Soft. Bowel sounds are normal. There is tenderness in the suprapubic area.  Musculoskeletal: She  exhibits no edema.  Neurological: She is alert. She has normal reflexes.  Skin: Skin is warm and dry.  Psychiatric: Thought content normal.    ED Course  Procedures (including critical care time)  Labs Reviewed  CBC - Abnormal; Notable for the following:    WBC 11.3 (*)    All other components within normal limits  DIFFERENTIAL - Abnormal; Notable for the following:    Neutrophils Relative 86 (*)    Neutro Abs 9.8 (*)    Lymphocytes Relative 7 (*)    All other components within normal limits  BASIC METABOLIC PANEL - Abnormal; Notable for the following:    Potassium 3.4 (*)    Glucose, Bld 109 (*)    GFR calc non Af Amer 58 (*)    GFR calc Af Amer 67 (*)    All other components within normal limits  URINALYSIS, ROUTINE W REFLEX MICROSCOPIC - Abnormal; Notable for the following:    Color, Urine AMBER (*) BIOCHEMICALS MAY BE AFFECTED BY COLOR   APPearance CLOUDY (*)    Hgb urine dipstick LARGE (*)    Ketones, ur 40 (*)    Protein, ur 30 (*)    Leukocytes, UA SMALL (*)    All other components within normal limits  URINE MICROSCOPIC-ADD ON - Abnormal; Notable for the following:    Bacteria, UA MANY (*)    Crystals CA OXALATE CRYSTALS (*)    All other components within normal limits  PREGNANCY, URINE  URINE CULTURE   Dg Abd 1 View  06/13/2011  *RADIOLOGY REPORT*  Clinical Data: 51 year old female with right kidney stone.  Planned lithotripsy.  ABDOMEN - 1 VIEW  Comparison: 04/21/2011, 04/11/2011.  Findings: Unchanged right lower pole 7 x 12 mm calculus.  Unchanged left upper pole 45 mm calculus, much more subtle radiographically.  Nonobstructed bowel gas pattern.  No osseous abnormality identified.  Other abdominal and pelvic visceral contours are within normal limits.  IMPRESSION:  Unchanged bilateral nephrolithiasis.  Original Report Authenticated By: Harley Hallmark, M.D.     No diagnosis found.  Results for orders placed during the hospital encounter of 06/15/11  CBC       Component Value Range   WBC 11.3 (*) 4.0 - 10.5 (K/uL)   RBC 4.41  3.87 - 5.11 (MIL/uL)   Hemoglobin 14.1  12.0 - 15.0 (g/dL)   HCT 94.7  09.6 - 28.3 (%)   MCV 92.3  78.0 - 100.0 (fL)   MCH 32.0  26.0 - 34.0 (pg)   MCHC 34.6  30.0 - 36.0 (g/dL)   RDW 66.2  94.7 - 65.4 (%)   Platelets 231  150 -  400 (K/uL)  DIFFERENTIAL      Component Value Range   Neutrophils Relative 86 (*) 43 - 77 (%)   Neutro Abs 9.8 (*) 1.7 - 7.7 (K/uL)   Lymphocytes Relative 7 (*) 12 - 46 (%)   Lymphs Abs 0.8  0.7 - 4.0 (K/uL)   Monocytes Relative 6  3 - 12 (%)   Monocytes Absolute 0.7  0.1 - 1.0 (K/uL)   Eosinophils Relative 0  0 - 5 (%)   Eosinophils Absolute 0.0  0.0 - 0.7 (K/uL)   Basophils Relative 0  0 - 1 (%)   Basophils Absolute 0.0  0.0 - 0.1 (K/uL)  BASIC METABOLIC PANEL      Component Value Range   Sodium 143  135 - 145 (mEq/L)   Potassium 3.4 (*) 3.5 - 5.1 (mEq/L)   Chloride 105  96 - 112 (mEq/L)   CO2 25  19 - 32 (mEq/L)   Glucose, Bld 109 (*) 70 - 99 (mg/dL)   BUN 11  6 - 23 (mg/dL)   Creatinine, Ser 2.13  0.50 - 1.10 (mg/dL)   Calcium 08.6  8.4 - 10.5 (mg/dL)   GFR calc non Af Amer 58 (*) >90 (mL/min)   GFR calc Af Amer 67 (*) >90 (mL/min)  URINALYSIS, ROUTINE W REFLEX MICROSCOPIC      Component Value Range   Color, Urine AMBER (*) YELLOW    APPearance CLOUDY (*) CLEAR    Specific Gravity, Urine 1.024  1.005 - 1.030    pH 6.5  5.0 - 8.0    Glucose, UA NEGATIVE  NEGATIVE (mg/dL)   Hgb urine dipstick LARGE (*) NEGATIVE    Bilirubin Urine NEGATIVE  NEGATIVE    Ketones, ur 40 (*) NEGATIVE (mg/dL)   Protein, ur 30 (*) NEGATIVE (mg/dL)   Urobilinogen, UA 0.2  0.0 - 1.0 (mg/dL)   Nitrite NEGATIVE  NEGATIVE    Leukocytes, UA SMALL (*) NEGATIVE   PREGNANCY, URINE      Component Value Range   Preg Test, Ur NEGATIVE  NEGATIVE   URINE MICROSCOPIC-ADD ON      Component Value Range   Squamous Epithelial / LPF RARE  RARE    WBC, UA 3-6  <3 (WBC/hpf)   RBC / HPF TOO NUMEROUS TO COUNT  <3  (RBC/hpf)   Bacteria, UA MANY (*) RARE    Crystals CA OXALATE CRYSTALS (*) NEGATIVE    Ct Abdomen Pelvis Wo Contrast  06/15/2011  *RADIOLOGY REPORT*  Clinical Data: Right flank pain, nausea, and vomiting.  Lithotripsy on 03/13.  CT ABDOMEN AND PELVIS WITHOUT CONTRAST  Technique:  Multidetector CT imaging of the abdomen and pelvis was performed following the standard protocol without intravenous contrast.  Comparison: 04/11/2011  Findings: Mild dependent atelectasis in the lung bases.  Multiple bilateral renal stones.  Largest stone is in the lower pole of the right kidney measures about 7 mm diameter.  Additional smaller stones in the right upper pole, right mid pole, and left upper pole.  Stones appears stable in size and location since the previous study.  There is interval development of right-sided pyelocaliectasis and ureterectasis with periureteral and pararenal stranding.  Small stones or stone fragments are demonstrated in the distal left ureter at and above the ureterovesicle junction. Infection is not excluded, but the appearance is most consistent with moderate obstruction.  Unenhanced appearance of the liver, spleen, gallbladder, pancreas, adrenal glands, abdominal aorta, and retroperitoneal lymph nodes is unremarkable.  The stomach, small bowel, and  colon are not dilated. No free air or free fluid in the abdomen.  Pelvis:  Small amount of free fluid in the pelvis is nonspecific and may be reactive or physiologic.  The uterus and adnexal structures are not enlarged.  The appendix is normal.  No inflammatory changes in the sigmoid colon.  Degenerative changes in the lumbar spine.  IMPRESSION: Stable appearance of bilateral intrarenal stones.  Interval development of multiple small stones or bone fragments in the distal right ureter with moderate proximal obstruction.  Small amount of free fluid in the pelvis.  Original Report Authenticated By: Marlon Pel, M.D.    MDM  Patient with  persistent pain post lithotripsy, plan IV antiemetics, IV narcotics and IV fluids and plan CT.  Patient medicated with phenergan IV and Fentanyl immediately following seeing patient.  Dilaudid added with IV zofran due to persistent pain only slightly better.    [phone] am CT report reviewed via phone with Dr. Marcello Fennel.  Dr. Marcello Fennel asked EDP reevaluate patient pain.  Patient states pain is approximately 6/10 persistently.  Case d/w Dr. Marcello Fennel following meds patient to be discharged.  With Dr. Marcello Fennel on phone, EDP reviewed this plan with the patient.  He will leave message for Dr. Mena Goes.  Patient to call at 8 am for appointment today.     Patient reassessed again. No further wretching in the ED. Awake in room on entrance and is resting comfortably in bed.   Pain reports she slightly improved per patient post toradol. Patient verbalizes understanding of plan to call office at 8 am to be seen today.  She is amenable to discharge and verbalizes that she follow up in the office this am.  Patient expressed gratitude for care received to EDP with patient's nurse, Helmut Muster, present during instructions.     Jasmine Awe, MD 06/15/11 0409  Margaret Byus K Kashawn Manzano-Rasch, MD 06/15/11 [id number]

## 2011-06-16 LAB — URINE CULTURE

## 2011-10-10 ENCOUNTER — Encounter: Payer: Self-pay | Admitting: *Deleted

## 2011-10-12 ENCOUNTER — Ambulatory Visit (INDEPENDENT_AMBULATORY_CARE_PROVIDER_SITE_OTHER): Payer: 59 | Admitting: Internal Medicine

## 2011-10-12 ENCOUNTER — Encounter: Payer: Self-pay | Admitting: Internal Medicine

## 2011-10-12 VITALS — BP 122/80 | HR 60 | Ht 67.0 in | Wt 131.2 lb

## 2011-10-12 DIAGNOSIS — K648 Other hemorrhoids: Secondary | ICD-10-CM

## 2011-10-12 DIAGNOSIS — K594 Anal spasm: Secondary | ICD-10-CM

## 2011-10-12 MED ORDER — HYDROCORTISONE ACETATE 25 MG RE SUPP: 25.0000 mg | Freq: Every day | RECTAL | Status: AC

## 2011-10-12 MED ORDER — POLYETHYLENE GLYCOL 3350 17 GM/SCOOP PO POWD: 17.0000 g | Freq: Every day | ORAL | Status: AC

## 2011-10-12 NOTE — Progress Notes (Signed)
Subjective:    Patient ID: Margaret Carney, female    DOB: April 09, 1960, 51 y.o.   MRN: [id number]  HPI This is a 51 year old white woman with recent colonoscopy in the spring. It was for screening, she had a 2 mm rectal adenoma, hemorrhoids and a question of proctitis but biopsies did not confirm that all was normal. She reports that prior to that she had some intermittent rectal bleeding with blood streaked on the stool, bright red blood. This is intermittent. She had a couple of episodes of painful rectal pain, that was spastic in origin and would awaken her from sleep as well. Subsequent to the colonoscopy she was better. She has been using fiber in her diet and occasional MiraLax and has controlled her bowel movements without constipation or straining. She's had some in her mid rectal bleeding still and lately increased, and she actually was eating some on the tissue paper in addition to streaked on the outside of the stool. There was no melena or hematochezia i.e. blood mixed in the stool. She saw some white dots at times on the stool as well as monitor what those were.  She had been taking a large amount of ibuprofen prior to having a kidney stone treated and has reduced that greatly but is somewhat concerned that could be related to this bleeding.  GI review of systems is otherwise negative. Allergies  Allergen Reactions  . Codeine Itching   Outpatient Prescriptions Prior to Visit  Medication Sig Dispense Refill  . hyoscyamine (LEVSIN, ANASPAZ) 0.125 MG tablet Take 1 tablet by mouth every 6 (six) hours as needed. For Abdominal pain      . ibuprofen (ADVIL,MOTRIN) 200 MG tablet Take 2 tablets (400 mg total) by mouth every 6 (six) hours as needed. For pain  30 tablet  0  . ondansetron (ZOFRAN-ODT) 4 MG disintegrating tablet Take 1 tablet by mouth every 8 (eight) hours as needed. For nausea      . polyethylene glycol (MIRALAX / GLYCOLAX) packet Take 17 g by mouth daily with breakfast.       .  promethazine (PHENERGAN) 12.5 MG tablet Take 0.5 tablets (6.25 mg total) by mouth every 8 (eight) hours as needed for nausea.  30 tablet  0  . Tamsulosin HCl (FLOMAX) 0.4 MG CAPS Take 1 capsule (0.4 mg total) by mouth daily after supper.  30 capsule  0   Past Medical History  Diagnosis Date  . MVP (mitral valve prolapse)   . Kidney stone on right side   . Allergy     seasonal  . Osteopenia   . Heart murmur   . GERD (gastroesophageal reflux disease)   . Diverticulosis 2013  . Internal and external hemorrhoids without complication 2013  . Hx of colonic polyps 2013    Tubular Adenoma 2 mm rectal   Past Surgical History  Procedure Date  . Colonoscopy w/ biopsies 2013  . Pitituary tumor 1989  . Laparoscopic endometriosis fulguration 1989  . Tonsillectomy and adenoidectomy 1968  . Lithotripsy     Nephrolithiasis      Review of Systems Low back pain    Objective:   Physical Exam Well-developed well-nourished white woman in no acute distress Rectal exam is performed with female staff present. The anoderm looks normal there is an old hemorrhoids seen in the 3:00 position in the left lateral decubitus. Rectal exam shows normal resting tone, there is brown stool, there could be slight anal stenosis, there is no mass.  Assessment & Plan:   1. Hemorrhoids, internal, with bleeding   We know she has these and colonoscopy. The history is consistent with this. I explained and reassured. I think the white dots are mucus. She does not have proctitis as best we can tell.   She'll use 25 mg hydrocortisone suppositories nightly x 7  then as needed. She was cautioned about using these regularly and to let a note bleeding worsens or changes to a darker color which could represent something different than the hemorrhoids.   2. Proctalgia fugax   Her story is but classic for this. I explained what this is, it is often a manifestation of IBS though I did not discuss that with her. She will  monitor for recurrence, I think the reassurance helped her.    CC: Astrid Divine, MD

## 2011-10-12 NOTE — Patient Instructions (Addendum)
Today we are giving you Miralax samples to use as directed.  A hemorrhoid handout has been provided for you to read and follow.  We have sent the following medications to your pharmacy for you to pick up at your convenience: Generic Anusol HC supp.  Thank you for choosing me and [facility].  Iva Boop, M.D., [facility]

## 2013-01-31 ENCOUNTER — Encounter (HOSPITAL_BASED_OUTPATIENT_CLINIC_OR_DEPARTMENT_OTHER): Payer: Self-pay | Admitting: Emergency Medicine

## 2013-01-31 ENCOUNTER — Emergency Department (HOSPITAL_BASED_OUTPATIENT_CLINIC_OR_DEPARTMENT_OTHER): Payer: 59

## 2013-01-31 ENCOUNTER — Emergency Department (HOSPITAL_BASED_OUTPATIENT_CLINIC_OR_DEPARTMENT_OTHER)
Admission: EM | Admit: 2013-01-31 | Discharge: 2013-01-31 | Disposition: A | Discharge status: Home / Self Care | Payer: 59 | Attending: Emergency Medicine | Admitting: Emergency Medicine

## 2013-01-31 DIAGNOSIS — R51 Headache: Secondary | ICD-10-CM

## 2013-01-31 DIAGNOSIS — R011 Cardiac murmur, unspecified: Secondary | ICD-10-CM | POA: Insufficient documentation

## 2013-01-31 DIAGNOSIS — Z860100 Personal history of colon polyps, unspecified: Secondary | ICD-10-CM | POA: Insufficient documentation

## 2013-01-31 DIAGNOSIS — Z87442 Personal history of urinary calculi: Secondary | ICD-10-CM | POA: Insufficient documentation

## 2013-01-31 DIAGNOSIS — G43909 Migraine, unspecified, not intractable, without status migrainosus: Secondary | ICD-10-CM | POA: Insufficient documentation

## 2013-01-31 DIAGNOSIS — Z79899 Other long term (current) drug therapy: Secondary | ICD-10-CM | POA: Insufficient documentation

## 2013-01-31 DIAGNOSIS — Z8719 Personal history of other diseases of the digestive system: Secondary | ICD-10-CM | POA: Insufficient documentation

## 2013-01-31 DIAGNOSIS — Z9089 Acquired absence of other organs: Secondary | ICD-10-CM | POA: Insufficient documentation

## 2013-01-31 DIAGNOSIS — Z8639 Personal history of other endocrine, nutritional and metabolic disease: Secondary | ICD-10-CM | POA: Insufficient documentation

## 2013-01-31 DIAGNOSIS — Z8739 Personal history of other diseases of the musculoskeletal system and connective tissue: Secondary | ICD-10-CM | POA: Insufficient documentation

## 2013-01-31 DIAGNOSIS — R519 Headache, unspecified: Secondary | ICD-10-CM

## 2013-01-31 DIAGNOSIS — Z3202 Encounter for pregnancy test, result negative: Secondary | ICD-10-CM | POA: Insufficient documentation

## 2013-01-31 DIAGNOSIS — Z862 Personal history of diseases of the blood and blood-forming organs and certain disorders involving the immune mechanism: Secondary | ICD-10-CM | POA: Insufficient documentation

## 2013-01-31 LAB — CBC WITH DIFFERENTIAL/PLATELET
BASOS ABS: 0 10*3/uL (ref 0.0–0.1)
Basophils Relative: 0 % (ref 0–1)
Eosinophils Absolute: 0 10*3/uL (ref 0.0–0.7)
Eosinophils Relative: 1 % (ref 0–5)
HEMATOCRIT: 44.5 % (ref 36.0–46.0)
HEMOGLOBIN: 15 g/dL (ref 12.0–15.0)
LYMPHS PCT: 13 % (ref 12–46)
Lymphs Abs: 0.8 10*3/uL (ref 0.7–4.0)
MCH: 31.3 pg (ref 26.0–34.0)
MCHC: 33.7 g/dL (ref 30.0–36.0)
MCV: 92.9 fL (ref 78.0–100.0)
MONO ABS: 0.4 10*3/uL (ref 0.1–1.0)
Monocytes Relative: 6 % (ref 3–12)
NEUTROS ABS: 5.1 10*3/uL (ref 1.7–7.7)
Neutrophils Relative %: 80 % — ABNORMAL HIGH (ref 43–77)
Platelets: 251 10*3/uL (ref 150–400)
RBC: 4.79 MIL/uL (ref 3.87–5.11)
RDW: 12.7 % (ref 11.5–15.5)
WBC: 6.3 10*3/uL (ref 4.0–10.5)

## 2013-01-31 LAB — BASIC METABOLIC PANEL
BUN: 15 mg/dL (ref 6–23)
CHLORIDE: 102 meq/L (ref 96–112)
CO2: 27 meq/L (ref 19–32)
CREATININE: 1 mg/dL (ref 0.50–1.10)
Calcium: 10.1 mg/dL (ref 8.4–10.5)
GFR calc Af Amer: 74 mL/min — ABNORMAL LOW (ref >90)
GFR calc non Af Amer: 64 mL/min — ABNORMAL LOW (ref >90)
Glucose, Bld: 96 mg/dL (ref 70–99)
POTASSIUM: 3.6 meq/L — AB (ref 3.7–5.3)
Sodium: 143 mEq/L (ref 137–147)

## 2013-01-31 LAB — URINALYSIS, ROUTINE W REFLEX MICROSCOPIC
Bilirubin Urine: NEGATIVE
GLUCOSE, UA: NEGATIVE mg/dL
HGB URINE DIPSTICK: NEGATIVE
Ketones, ur: 15 mg/dL — AB
Leukocytes, UA: NEGATIVE
Nitrite: NEGATIVE
Protein, ur: NEGATIVE mg/dL
SPECIFIC GRAVITY, URINE: 1.017 (ref 1.005–1.030)
UROBILINOGEN UA: 1 mg/dL (ref 0.0–1.0)
pH: 7.5 (ref 5.0–8.0)

## 2013-01-31 LAB — PREGNANCY, URINE: PREG TEST UR: NEGATIVE

## 2013-01-31 MED ORDER — DIPHENHYDRAMINE HCL 50 MG/ML IJ SOLN
25.0000 mg | Freq: Once | INTRAMUSCULAR | Status: AC
  Administered 2013-01-31: 25 mg via INTRAVENOUS
  Filled 2013-01-31: qty 1

## 2013-01-31 MED ORDER — SODIUM CHLORIDE 0.9 % IV BOLUS (SEPSIS)
1000.0000 mL | Freq: Once | INTRAVENOUS | Status: AC
  Administered 2013-01-31: 1000 mL via INTRAVENOUS

## 2013-01-31 MED ORDER — DEXAMETHASONE SODIUM PHOSPHATE 10 MG/ML IJ SOLN
10.0000 mg | Freq: Once | INTRAMUSCULAR | Status: AC
  Administered 2013-01-31: 10 mg via INTRAVENOUS
  Filled 2013-01-31: qty 1

## 2013-01-31 MED ORDER — PROMETHAZINE HCL 25 MG/ML IJ SOLN
12.5000 mg | Freq: Once | INTRAMUSCULAR | Status: AC
  Administered 2013-01-31: 12.5 mg via INTRAVENOUS
  Filled 2013-01-31: qty 1

## 2013-01-31 MED ORDER — METOCLOPRAMIDE HCL 5 MG/ML IJ SOLN
10.0000 mg | Freq: Once | INTRAMUSCULAR | Status: AC
  Administered 2013-01-31: 10 mg via INTRAVENOUS
  Filled 2013-01-31: qty 2

## 2013-01-31 MED ORDER — PROMETHAZINE HCL 25 MG PO TABS: 25.0000 mg | ORAL_TABLET | Freq: Four times a day (QID) | ORAL | Status: DC | PRN

## 2013-01-31 NOTE — ED Notes (Signed)
Pt states nausea is less,  Up to bathroom.   Given ginger ale to sip

## 2013-01-31 NOTE — ED Provider Notes (Signed)
CSN: [id number]     Arrival date & time 01/31/13  1634 History  This chart was scribed for Margaret Johns, MD by Maree Erie, ED Scribe. The patient was seen in room MH06/MH06. Patient's care was started at 7:26 PM.      Chief Complaint  Patient presents with  . Headache    Patient is a 53 y.o. female presenting with headaches. The history is provided by the patient. No language interpreter was used.  Headache Associated symptoms: nausea, photophobia and vomiting   Associated symptoms: no abdominal pain, no back pain, no congestion, no cough, no diarrhea, no dizziness, no fatigue, no fever and no numbness     HPI Comments: Margaret Carney is a 53 y.o. female who presents to the Emergency Department complaining of a sudden-onset, worsening, constant headache that began this morning after eating breakfast. She describes the pain as pressure in the back and top of her head. She has had associated nausea and emesis today with the headache as well as photophobia. The headache and nausea are worsened when she sits up. She states that she has had few headaches in the past that subsided with Excedrin, but she states this headache today feels different. She states she also had headaches prior to having a pituitary tumor removed about twenty five years ago, but and states that those headaches felt similar. She denies fever, rhinorrhea, congestion, dizziness or numbness or weakness in her extremities. She states that she has been under a lot of stress recently which may be causing the headache. The stress has been giving her what she believes may be panic attacks recently.   Past Medical History  Diagnosis Date  . MVP (mitral valve prolapse)   . Kidney stone on right side   . Allergy     seasonal  . Osteopenia   . Heart murmur   . GERD (gastroesophageal reflux disease)   . Diverticulosis 2013  . Internal and external hemorrhoids without complication 7371  . Hx of colonic polyps 2013    Tubular  Adenoma 2 mm rectal   Past Surgical History  Procedure Laterality Date  . Colonoscopy w/ biopsies  2013  . Pitituary tumor  1989  . Laparoscopic endometriosis fulguration  1989  . Tonsillectomy and adenoidectomy  1968  . Lithotripsy      Nephrolithiasis   Family History  Problem Relation Age of Onset  . Colon polyps Mother   . Colon cancer Neg Hx   . Stomach cancer Neg Hx   . Ulcerative colitis Neg Hx   . Esophageal cancer Neg Hx   . Breast cancer Sister    History  Substance Use Topics  . Smoking status: Never Smoker   . Smokeless tobacco: Never Used  . Alcohol Use: No   OB History   Grav Para Term Preterm Abortions TAB SAB Ect Mult Living                 Review of Systems  Constitutional: Negative for fever, chills, diaphoresis and fatigue.  HENT: Negative for congestion, rhinorrhea and sneezing.   Eyes: Positive for photophobia.  Respiratory: Negative for cough, chest tightness and shortness of breath.   Cardiovascular: Negative for chest pain and leg swelling.  Gastrointestinal: Positive for nausea and vomiting. Negative for abdominal pain, diarrhea and blood in stool.  Genitourinary: Negative for frequency, hematuria, flank pain and difficulty urinating.  Musculoskeletal: Negative for arthralgias and back pain.  Skin: Negative for rash.  Neurological: Positive for headaches.  Negative for dizziness, speech difficulty, weakness and numbness.    Allergies  Codeine  Home Medications   Current Outpatient Rx  Name  Route  Sig  Dispense  Refill  . hyoscyamine (LEVSIN, ANASPAZ) 0.125 MG tablet   Oral   Take 1 tablet by mouth every 6 (six) hours as needed. For Abdominal pain         . ibuprofen (ADVIL,MOTRIN) 200 MG tablet   Oral   Take 2 tablets (400 mg total) by mouth every 6 (six) hours as needed. For pain   30 tablet   0   . ondansetron (ZOFRAN-ODT) 4 MG disintegrating tablet   Oral   Take 1 tablet by mouth every 8 (eight) hours as needed. For nausea          . polyethylene glycol (MIRALAX / GLYCOLAX) packet   Oral   Take 17 g by mouth daily with breakfast.          . promethazine (PHENERGAN) 25 MG tablet   Oral   Take 1 tablet (25 mg total) by mouth every 6 (six) hours as needed for nausea or vomiting.   30 tablet   0    Triage Vitals: BP 136/80  Pulse 74  Temp(Src) 97.6 F (36.4 C) (Oral)  Resp 18  SpO2 100%  LMP 09/08/2010  Physical Exam  Constitutional: She is oriented to person, place, and time. She appears well-developed and well-nourished.  HENT:  Head: Normocephalic and atraumatic.  Eyes: Pupils are equal, round, and reactive to light.  Normal fundoscopic exam.   Neck: Normal range of motion. Neck supple.  No meningeal signs.   Cardiovascular: Normal rate, regular rhythm and normal heart sounds.   Pulmonary/Chest: Effort normal and breath sounds normal. No respiratory distress. She has no wheezes. She has no rales. She exhibits no tenderness.  Abdominal: Soft. Bowel sounds are normal. There is no tenderness. There is no rebound and no guarding.  Musculoskeletal: Normal range of motion. She exhibits no edema.  Lymphadenopathy:    She has no cervical adenopathy.  Neurological: She is alert and oriented to person, place, and time.  Motor 5/5 all extremities. Sensation grossly intact to light touch all extremities. No facial drooping or aphasia. No pronator drift.   Skin: Skin is warm and dry. No rash noted.  Psychiatric: She has a normal mood and affect.    ED Course  Procedures (including critical care time)  DIAGNOSTIC STUDIES: Oxygen Saturation is 100% on room air, normal by my interpretation.    COORDINATION OF CARE: 7:30 PM -Will order head CT. Patient verbalizes understanding and agrees with treatment plan.    Labs Review Labs Reviewed  URINALYSIS, ROUTINE W REFLEX MICROSCOPIC - Abnormal; Notable for the following:    APPearance CLOUDY (*)    Ketones, ur 15 (*)    All other components within  normal limits  CBC WITH DIFFERENTIAL - Abnormal; Notable for the following:    Neutrophils Relative % 80 (*)    All other components within normal limits  BASIC METABOLIC PANEL - Abnormal; Notable for the following:    Potassium 3.6 (*)    GFR calc non Af Amer 64 (*)    GFR calc Af Amer 74 (*)    All other components within normal limits  PREGNANCY, URINE   Imaging Review Ct Head Wo Contrast  01/31/2013   CLINICAL DATA:  Headache, nausea  EXAM: CT HEAD WITHOUT CONTRAST  TECHNIQUE: Contiguous axial images were obtained from the base  of the skull through the vertex without intravenous contrast.  COMPARISON:  None.  FINDINGS: No mass lesion. No midline shift. No acute hemorrhage or hematoma. No extra-axial fluid collections. No evidence of acute infarction.  IMPRESSION: Negative   Electronically Signed   By: Skipper Cliche M.D.   On: 01/31/2013 20:21    EKG Interpretation   None       MDM   1. Headache    Patient presents with a gradually worsening headache 3 today. She states initially that it did feel different than her typical headaches. She had a CT scan that did not show any evidence of acute hemorrhage or tumor. She was given a migraine cocktail and then some Phenergan and she was feeling much better after that. I did have a long discussion with her that if she felt like these headaches are different than her typical migraine type headaches and we should explore the possibility of doing a lumbar puncture to rule out an occult subarachnoid hemorrhage. At this point patient feels like her headache is similar to past headaches and she does not want to do the lumbar puncture. She was discharged home and I advised her if she feels a dull ache or headache is becoming different or worse or she changes her mind about the lumbar puncture to return to emergency department.  I personally performed the services described in this documentation, which was scribed in my presence.  The recorded  information has been reviewed and considered.     Margaret Johns, MD 01/31/13 2350

## 2013-01-31 NOTE — Discharge Instructions (Signed)
Migraine Headache A migraine headache is an intense, throbbing pain on one or both sides of your head. A migraine can last for 30 minutes to several hours. CAUSES  The exact cause of a migraine headache is not always known. However, a migraine may be caused when nerves in the brain become irritated and release chemicals that cause inflammation. This causes pain. SYMPTOMS  Pain on one or both sides of your head.  Pulsating or throbbing pain.  Severe pain that prevents daily activities.  Pain that is aggravated by any physical activity.  Nausea, vomiting, or both.  Dizziness.  Pain with exposure to bright lights, loud noises, or activity.  General sensitivity to bright lights, loud noises, or smells. Before you get a migraine, you may get warning signs that a migraine is coming (aura). An aura may include:  Seeing flashing lights.  Seeing bright spots, halos, or zig-zag lines.  Having tunnel vision or blurred vision.  Having feelings of numbness or tingling.  Having trouble talking.  Having muscle weakness. MIGRAINE TRIGGERS  Alcohol.  Smoking.  Stress.  Menstruation.  Aged cheeses.  Foods or drinks that contain nitrates, glutamate, aspartame, or tyramine.  Lack of sleep.  Chocolate.  Caffeine.  Hunger.  Physical exertion.  Fatigue.  Medicines used to treat chest pain (nitroglycerine), birth control pills, estrogen, and some blood pressure medicines. DIAGNOSIS  A migraine headache is often diagnosed based on:  Symptoms.  Physical examination.  A CT scan or MRI of your head. TREATMENT Medicines may be given for pain and nausea. Medicines can also be given to help prevent recurrent migraines.  HOME CARE INSTRUCTIONS  Only take over-the-counter or prescription medicines for pain or discomfort as directed by your caregiver. The use of long-term narcotics is not recommended.  Lie down in a dark, quiet room when you have a migraine.  Keep a journal  to find out what may trigger your migraine headaches. For example, write down:  What you eat and drink.  How much sleep you get.  Any change to your diet or medicines.  Limit alcohol consumption.  Quit smoking if you smoke.  Get 7 to 9 hours of sleep, or as recommended by your caregiver.  Limit stress.  Keep lights dim if bright lights bother you and make your migraines worse. SEEK IMMEDIATE MEDICAL CARE IF:   Your migraine becomes severe.  You have a fever.  You have a stiff neck.  You have vision loss.  You have muscular weakness or loss of muscle control.  You start losing your balance or have trouble walking.  You feel faint or pass out.  You have severe symptoms that are different from your first symptoms. MAKE SURE YOU:   Understand these instructions.  Will watch your condition.  Will get help right away if you are not doing well or get worse. Document Released: 01/17/2005 Document Revised: 04/11/2011 Document Reviewed: 01/07/2011 [employer] Patient Information 2014 [employer].  

## 2013-01-31 NOTE — ED Notes (Signed)
Patient ambulatory to restroom without difficulty and with no assistance

## 2013-01-31 NOTE — ED Notes (Signed)
Pressure in her head since this am. States last night she felt strange but was unable to describe how she felt.

## 2013-01-31 NOTE — ED Notes (Signed)
Registration states patient is laying in the floor in the w/a. Walked to triage 1 and sitting in the chair.

## 2013-01-31 NOTE — ED Notes (Signed)
Patient transported to CT 

## 2013-02-07 ENCOUNTER — Other Ambulatory Visit: Payer: Self-pay | Admitting: Gynecology

## 2013-02-07 DIAGNOSIS — N6029 Fibroadenosis of unspecified breast: Secondary | ICD-10-CM

## 2013-02-07 DIAGNOSIS — R2232 Localized swelling, mass and lump, left upper limb: Secondary | ICD-10-CM

## 2013-02-13 ENCOUNTER — Other Ambulatory Visit: Payer: Self-pay | Admitting: Gynecology

## 2013-02-13 DIAGNOSIS — R2230 Localized swelling, mass and lump, unspecified upper limb: Secondary | ICD-10-CM

## 2013-02-13 DIAGNOSIS — N6029 Fibroadenosis of unspecified breast: Secondary | ICD-10-CM

## 2013-02-14 ENCOUNTER — Ambulatory Visit
Admission: RE | Admit: 2013-02-14 | Discharge: 2013-02-14 | Disposition: A | Discharge status: Home / Self Care | Payer: 59 | Source: Ambulatory Visit | Attending: Gynecology | Admitting: Gynecology

## 2013-02-14 DIAGNOSIS — R2230 Localized swelling, mass and lump, unspecified upper limb: Secondary | ICD-10-CM

## 2013-02-14 DIAGNOSIS — R2232 Localized swelling, mass and lump, left upper limb: Secondary | ICD-10-CM

## 2013-02-14 DIAGNOSIS — N6029 Fibroadenosis of unspecified breast: Secondary | ICD-10-CM

## 2013-03-05 ENCOUNTER — Ambulatory Visit: Payer: 59 | Admitting: Neurology

## 2013-04-03 ENCOUNTER — Ambulatory Visit (INDEPENDENT_AMBULATORY_CARE_PROVIDER_SITE_OTHER): Payer: 59 | Admitting: Neurology

## 2013-04-03 ENCOUNTER — Encounter: Payer: Self-pay | Admitting: Neurology

## 2013-04-03 VITALS — BP 124/70 | HR 68 | Temp 97.7°F | Resp 16 | Ht 68.4 in | Wt 131.2 lb

## 2013-04-03 DIAGNOSIS — G43709 Chronic migraine without aura, not intractable, without status migrainosus: Secondary | ICD-10-CM

## 2013-04-03 DIAGNOSIS — IMO0002 Reserved for concepts with insufficient information to code with codable children: Secondary | ICD-10-CM

## 2013-04-03 DIAGNOSIS — R51 Headache: Secondary | ICD-10-CM

## 2013-04-03 DIAGNOSIS — R519 Headache, unspecified: Secondary | ICD-10-CM

## 2013-04-03 DIAGNOSIS — G43909 Migraine, unspecified, not intractable, without status migrainosus: Secondary | ICD-10-CM | POA: Insufficient documentation

## 2013-04-03 DIAGNOSIS — G43009 Migraine without aura, not intractable, without status migrainosus: Secondary | ICD-10-CM

## 2013-04-03 HISTORY — DX: Reserved for concepts with insufficient information to code with codable children: IMO0002

## 2013-04-03 MED ORDER — FLURBIPROFEN 50 MG PO TABS: 50.0000 mg | ORAL_TABLET | Freq: Two times a day (BID) | ORAL | Status: DC

## 2013-04-03 MED ORDER — SUMATRIPTAN SUCCINATE 50 MG PO TABS: ORAL_TABLET | ORAL | Status: DC

## 2013-04-03 MED ORDER — NORTRIPTYLINE HCL 10 MG PO CAPS: ORAL_CAPSULE | ORAL | Status: DC

## 2013-04-03 NOTE — Progress Notes (Signed)
a [facility] Clinic Note - Initial Visit   Date: 04/03/2013    Margaret Carney MRN: [id number] DOB: 1960/02/29   Dear Dr Laurann Montana:  Thank you for your kind referral of Margaret Carney for consultation of headaches. Although her history is well known to you, please allow [place] to reiterate it for the purpose of our medical record. The patient was accompanied to the clinic by self.    History of Present Illness: Margaret Carney is a 53 y.o. right-handed Caucasian female with history of hyperlipidemia, Cushing's disease, kidney stones, and known pituitary adenoma s/p resection (1990) presenting for evaluation of headaches.  Starting in the fall of 2014, she started developing headaches, characterized as pressure-like usually starting at the base of the neck/head.  Sometimes pressure will radiate into her head.  She has associated nausea and vomiting. She reports pain is variable in duration, usually lasting several days.  She denies denies photophobia, phonophobia, numbness/tingling, or vision problems. She takes excedrin migraine which helps, which she is taking almost every day for the past several months. She has not noticed any specific triggers.  They can occur at night or during the day.  They do not wake her up from sleeping.  She has had a "few" headache-free days in the past month.  On New Years Day, she developed severe head pressure and nausea.  Pain was better when she laid her head down, but did not subside, so went to the Emergency Department.  She was given migraine cocktail including steroids which significantly helped.    Out-side paper records, electronic medical record, and images have been reviewed where available and summarized as:  CT head 02/14/2013:  Negative  MRI brain 05/07/2004:   1. MRI of the brain within normal limits with special attention to the pituitary and surrounding structures.  2. No evidence for recurrent pituitary adenoma.       Past Medical History  Diagnosis Date  . MVP (mitral valve prolapse)   . Kidney stone on right side   . Allergy     seasonal  . Osteopenia   . Heart murmur   . GERD (gastroesophageal reflux disease)   . Diverticulosis 2013  . Internal and external hemorrhoids without complication 4166  . Hx of colonic polyps 2013    Tubular Adenoma 2 mm rectal    Past Surgical History  Procedure Laterality Date  . Colonoscopy w/ biopsies  2013  . Pitituary tumor  1989  . Laparoscopic endometriosis fulguration  1989  . Tonsillectomy and adenoidectomy  1968  . Lithotripsy      Nephrolithiasis     Medications:  Current Outpatient Prescriptions on File Prior to Visit  Medication Sig Dispense Refill  . hyoscyamine (LEVSIN, ANASPAZ) 0.125 MG tablet Take 1 tablet by mouth every 6 (six) hours as needed. For Abdominal pain      . ibuprofen (ADVIL,MOTRIN) 200 MG tablet Take 2 tablets (400 mg total) by mouth every 6 (six) hours as needed. For pain  30 tablet  0  . ondansetron (ZOFRAN-ODT) 4 MG disintegrating tablet Take 1 tablet by mouth every 8 (eight) hours as needed. For nausea      . polyethylene glycol (MIRALAX / GLYCOLAX) packet Take 17 g by mouth daily with breakfast.       . promethazine (PHENERGAN) 25 MG tablet Take 1 tablet (25 mg total) by mouth every 6 (six) hours as needed for nausea or vomiting.  30 tablet  0  No current facility-administered medications on file prior to visit.    Allergies:  Allergies  Allergen Reactions  . Codeine Itching    Family History: Family History  Problem Relation Age of Onset  . Colon polyps Mother   . Colon cancer Neg Hx   . Stomach cancer Neg Hx   . Ulcerative colitis Neg Hx   . Esophageal cancer Neg Hx   . Breast cancer Sister   . Diabetes Father     Living, 54  . Hypertension Mother     Living, 66  . Dementia Mother     Social History: History   Social History  . Marital Status: Married    Spouse Name: N/A    Number of  Children: 1  . Years of Education: N/A   Occupational History  . Project Licensed conveyancer   Social History Main Topics  . Smoking status: Never Smoker   . Smokeless tobacco: Never Used  . Alcohol Use: No  . Drug Use: No  . Sexual Activity: No   Other Topics Concern  . Not on file   Social History Narrative   Lives with husband.  They have one son.   She works as a Government social research officer.   Highest level of education:  Two years of college    Review of Systems:  CONSTITUTIONAL: No fevers, chills, night sweats, or weight loss.   EYES: No visual changes or eye pain +headaches ENT: No hearing changes.  No history of nose bleeds.   RESPIRATORY: No cough, wheezing and shortness of breath.   CARDIOVASCULAR: Negative for chest pain, and palpitations.   GI: Negative for abdominal discomfort, blood in stools or black stools. +nausea No recent change in bowel habits.   GU:  No history of incontinence.   MUSCLOSKELETAL: No history of joint pain or swelling.  No myalgias.   SKIN: Negative for lesions, rash, and itching.   HEMATOLOGY/ONCOLOGY: Negative for prolonged bleeding, bruising easily, and swollen nodes.    ENDOCRINE: Negative for cold or heat intolerance, polydipsia or goiter.   PSYCH:  +depression or anxiety symptoms.   NEURO: As Above.   Vital Signs:  BP 124/70  Pulse 68  Temp(Src) 97.7 F (36.5 C)  Resp 16  Ht 5' 8.4" (1.737 m)  Wt 131 lb 3.2 oz (59.512 kg)  BMI 19.72 kg/m2  LMP 09/08/2010   General Medical Exam:   General:  Well appearing, comfortable.   Eyes/ENT: see cranial nerve examination.   Neck: No masses appreciated.  Full range of motion without tenderness.  No carotid bruits. Respiratory:  Clear to auscultation, good air entry bilaterally.   Cardiac:  Regular rate and rhythm, no murmur.   Back:  No pain to palpation of spinous processes.   Extremities:  No deformities, edema, or skin discoloration. Good capillary refill.   Skin:  Hyperhidrosis of  the palms and feet, no rashes.  Neurological Exam: MENTAL STATUS including orientation to time, place, person, recent and remote memory, attention span and concentration, language, and fund of knowledge is normal.  Speech is not dysarthric.  CRANIAL NERVES: II:  No visual field defects.  Unremarkable fundi.   III-IV-VI: Pupils equal round and reactive to light.  Normal conjugate, extra-ocular eye movements in all directions of gaze.  No nystagmus.  No ptosis.   V:  Normal facial sensation.    VII:  Normal facial symmetry and movements.   VIII:  Normal hearing and vestibular function.   IX-X:  Normal palatal  movement.   XI:  Normal shoulder shrug and head rotation.   XII:  Normal tongue strength and range of motion, no deviation or fasciculation.  MOTOR:  No atrophy, fasciculations or abnormal movements.  No pronator drift.  Tone is normal.    Right Upper Extremity:    Left Upper Extremity:    Deltoid  5/5   Deltoid  5/5   Biceps  5/5   Biceps  5/5   Triceps  5/5   Triceps  5/5   Wrist extensors  5/5   Wrist extensors  5/5   Wrist flexors  5/5   Wrist flexors  5/5   Finger extensors  5/5   Finger extensors  5/5   Finger flexors  5/5   Finger flexors  5/5   Dorsal interossei  5/5   Dorsal interossei  5/5   Abductor pollicis  5/5   Abductor pollicis  5/5   Tone (Ashworth scale)  0  Tone (Ashworth scale)  0   Right Lower Extremity:    Left Lower Extremity:    Hip flexors  5/5   Hip flexors  5/5   Hip extensors  5/5   Hip extensors  5/5   Knee flexors  5/5   Knee flexors  5/5   Knee extensors  5/5   Knee extensors  5/5   Dorsiflexors  5/5   Dorsiflexors  5/5   Plantarflexors  5/5   Plantarflexors  5/5   Toe extensors  5/5   Toe extensors  5/5   Toe flexors  5/5   Toe flexors  5/5   Tone (Ashworth scale)  0  Tone (Ashworth scale)  0   MSRs:  Right                                                                 Left brachioradialis 2+  brachioradialis 2+  biceps 2+  biceps 2+   triceps 2+  triceps 2+  patellar 3+  patellar 3+  ankle jerk 2+  ankle jerk 2+  Hoffman no  Hoffman no  plantar response down  plantar response down   SENSORY:  Normal and symmetric perception of light touch, pinprick, vibration, and proprioception.  Romberg's sign absent.   COORDINATION/GAIT: Normal finger-to- nose-finger and heel-to-shin.  Intact rapid alternating movements bilaterally.  Able to rise from a chair without using arms.  Gait narrow based and stable.    IMPRESSION: Margaret Carney is a 53 year-old female with history of pituitary adenoma s/p resection presenting for evaluation of headaches and nausea.  Exam shows brisk patella jerks which is most likely normal for patient given absence of other upper motor neuron findings.  Based her history and exam, she most likely has chronic migraine and maybe also developing medication overuse headache from daily excedrin use.  Given history of pituitary adenoma and new onset headaches, I would like to obtain MRI brain.  In the meantime, I will start on her preventative therapy and try to optimize her abortive medications.   PLAN/RECOMMENDATIONS:  1.  MRI brain wwo contrast 2.  For preventative therapy, start nortriptyline as follows:  Week 1:  Take one tablet at bedtime  Week 2:  Take 2 tablets at bedtime  Week 3:  Take 3  tablets at bedtime  Week 4:  Take 4 tablets at bedtime  Week 5:  Take 5 tablets at bedtime (50mg )  Call office for a new prescription for 50mg  tablets 3.  For rescue therapy,   - For moderate headaches, take Ansaid 50mg  every 12 hours as needed for pain  - For severe headaches, take Imitrex 50mg  at headache onset, if no improvement in 2 hours, ok to take one tablet again  - Do not take the rescue medications more than twice per week 4.  Try to cut back on exedrin to prevent medication overuse headaches 5.  Return to 4- weeks   The duration of this appointment visit was 50 minutes of face-to-face time with the  patient.  Greater than 50% of this time was spent in counseling, explanation of diagnosis, planning of further management, and coordination of care.   Thank you for allowing me to participate in patient's care.  If I can answer any additional questions, I would be pleased to do so.    Sincerely,    Donika K. Posey Pronto, DO

## 2013-04-03 NOTE — Patient Instructions (Addendum)
1.  We have scheduled you at [place] for your OPEN MRI Brain with and without contrast on 04/10/13 at 8:30 pm. Please arrive 30 minutes prior and go to [place]. If you need to reschedule please call [phone]. 2.  For preventative therapy, start nortriptyline as follows:  Week 1:  Take one tablet at bedtime  Week 2:  Take 2 tablets at bedtime  Week 3:  Take 3 tablets at bedtime  Week 4:  Take 4 tablets at bedtime  Week 5:  Take 5 tablets at bedtime (50mg )  Call office for a new prescription for 50mg  tablets 3.  For rescue therapy:   - For moderate headaches, take Ansaid 50mg  every 12 hours as needed for pain  - For severe headaches, take Imitrex 50mg  at headache onset, if no improvement in 2 hours, ok to take one tablet again  - Do NOT take the rescue medications more than twice per week 4.  Try to cut back on exedrin 5.  Recommend dilated eye exam 6.  Return to clinic in 4-weeks

## 2013-04-04 NOTE — Progress Notes (Signed)
Notes faxed.

## 2013-04-10 ENCOUNTER — Ambulatory Visit
Admission: RE | Admit: 2013-04-10 | Discharge: 2013-04-10 | Disposition: A | Discharge status: Home / Self Care | Payer: 59 | Source: Ambulatory Visit | Attending: Neurology | Admitting: Neurology

## 2013-04-10 DIAGNOSIS — R519 Headache, unspecified: Secondary | ICD-10-CM

## 2013-04-10 DIAGNOSIS — R51 Headache: Secondary | ICD-10-CM

## 2013-04-10 DIAGNOSIS — G43009 Migraine without aura, not intractable, without status migrainosus: Secondary | ICD-10-CM

## 2013-04-10 MED ORDER — GADOBENATE DIMEGLUMINE 529 MG/ML IV SOLN
7.0000 mL | Freq: Once | INTRAVENOUS | Status: AC | PRN
  Administered 2013-04-10: 7 mL via INTRAVENOUS

## 2013-04-29 ENCOUNTER — Telehealth: Payer: Self-pay | Admitting: Neurology

## 2013-04-29 NOTE — Telephone Encounter (Signed)
Noted.  Quante Pettry K. Ardith Test, DO   

## 2013-04-29 NOTE — Telephone Encounter (Signed)
Pt canc 05/02/13 follow up appt d/t work conflict. She will call back to r/s / Margaret Carney

## 2013-05-02 ENCOUNTER — Ambulatory Visit: Payer: 59 | Admitting: Neurology

## 2013-10-24 ENCOUNTER — Telehealth: Payer: Self-pay | Admitting: Internal Medicine

## 2013-10-24 NOTE — Telephone Encounter (Signed)
Patient moved up to 10/31/13.  All questions answered.  She is encouraged to start Miralax for rectal pain and the feeling she is not completely evacuating with BM

## 2013-10-31 ENCOUNTER — Ambulatory Visit: Payer: 59 | Admitting: Nurse Practitioner

## 2013-11-01 ENCOUNTER — Ambulatory Visit: Payer: 59 | Admitting: Nurse Practitioner

## 2013-11-05 ENCOUNTER — Ambulatory Visit: Payer: 59 | Admitting: Nurse Practitioner

## 2013-11-20 ENCOUNTER — Ambulatory Visit: Payer: 59 | Admitting: Nurse Practitioner

## 2013-11-28 ENCOUNTER — Encounter: Payer: Self-pay | Admitting: Nurse Practitioner

## 2013-11-28 ENCOUNTER — Ambulatory Visit: Payer: 59 | Admitting: Nurse Practitioner

## 2013-11-28 VITALS — BP 100/60 | HR 64 | Ht 68.0 in | Wt 139.2 lb

## 2013-11-28 DIAGNOSIS — K648 Other hemorrhoids: Secondary | ICD-10-CM

## 2013-11-28 DIAGNOSIS — K594 Anal spasm: Secondary | ICD-10-CM

## 2013-11-28 MED ORDER — POLYETHYLENE GLYCOL 3350 17 GM/SCOOP PO POWD: ORAL | Status: DC

## 2013-11-28 NOTE — Patient Instructions (Signed)
Your colonoscopy is due 05-20-2018.  You will receive a letter that year in March of 2020.  Avoid straining Use Miralax , 17 grams in 8 oz of juice or water.  You can use Glycerin suppositories as needed.

## 2013-11-28 NOTE — Progress Notes (Signed)
     History of Present Illness:   Patient is a 53 year old female known to Dr. Carlean Purl for history of an adenomatous rectal polyp in 2013. Patient was last evaluated September 2013 at which time she complained of intermittent rectal pain described as" spastic in origin". Patient had a known history of internal hemorrhoids, felt to be responsible for the bleeding. She was given hydrocortisone enemas to use nightly for a week. Rectal pain was felt to be secondary to proctalgia fugax.  Patient made today's appointment several weeks ago during which time she was having recurrent rectal pain. Her pain is much better now. Episodes are transient. She still has occasional blood with bowel movements. She has been using MiraLAX on an as-needed basis. Her stools are soft but sometimes certain maneuvers are needed to facilitate evacuation.  Current Medications, Allergies, Past Medical History, Past Surgical History, Family History and Social History were reviewed in [facility] record.  Physical Exam: General: Pleasant, well developed , white female in no acute distress Head: Normocephalic and atraumatic Eyes:  sclerae anicteric, conjunctiva pink  Ears: Normal auditory acuity Lungs: Clear throughout to auscultation Heart: Regular rate and rhythm Abdomen: Soft, non distended, non-tender. No masses, no hepatomegaly. Normal bowel sounds Rectal: no masses felt. On anoscopy there were minimally inflamed internal hemorrhoids.  Musculoskeletal: Symmetrical with no gross deformities  Extremities: No edema  Neurological: Alert oriented x 4, grossly nonfocal Psychological:  Alert and cooperative. 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 We discussed treatment options such as SL antispasmodics but patient will hold off  for now since pain has improved.   2. Chronic, intermittent rectal bleeding with BMs. Patient has known internal hemorrhoids. No significant bleeding as of late and internal hemorrhoids only minimally inflamed on anoscopy today. Will hold off on another course of steroid suppositories for now. If bleeding becomes more problematic patient will call for refill on Anusol HC suppositories . Hemorrhoidal banding may also be option.

## 2013-11-29 ENCOUNTER — Encounter: Payer: Self-pay | Admitting: Nurse Practitioner

## 2013-11-29 DIAGNOSIS — K594 Anal spasm: Secondary | ICD-10-CM | POA: Insufficient documentation

## 2013-11-29 DIAGNOSIS — K648 Other hemorrhoids: Secondary | ICD-10-CM | POA: Insufficient documentation

## 2013-12-08 NOTE — Progress Notes (Signed)
Other possibility would be anal fissure so would also consider diltiazem or NTG ointment if recurrent rectal pain - though it has seemed that the pain was higher than fissure pain.  Otherwise, agree with Ms. Vanita Ingles assessment and plan. Gatha Mayer, MD, [facility]

## 2014-01-20 IMAGING — CR DG CHEST 2V
2 series · 2 of 2 positions shown · non-contrast
Comparison: Chest x-ray of 04/19/2010

CLINICAL DATA: Occasional shortness of breath

CHEST - 2 VIEW

[w chest pa]
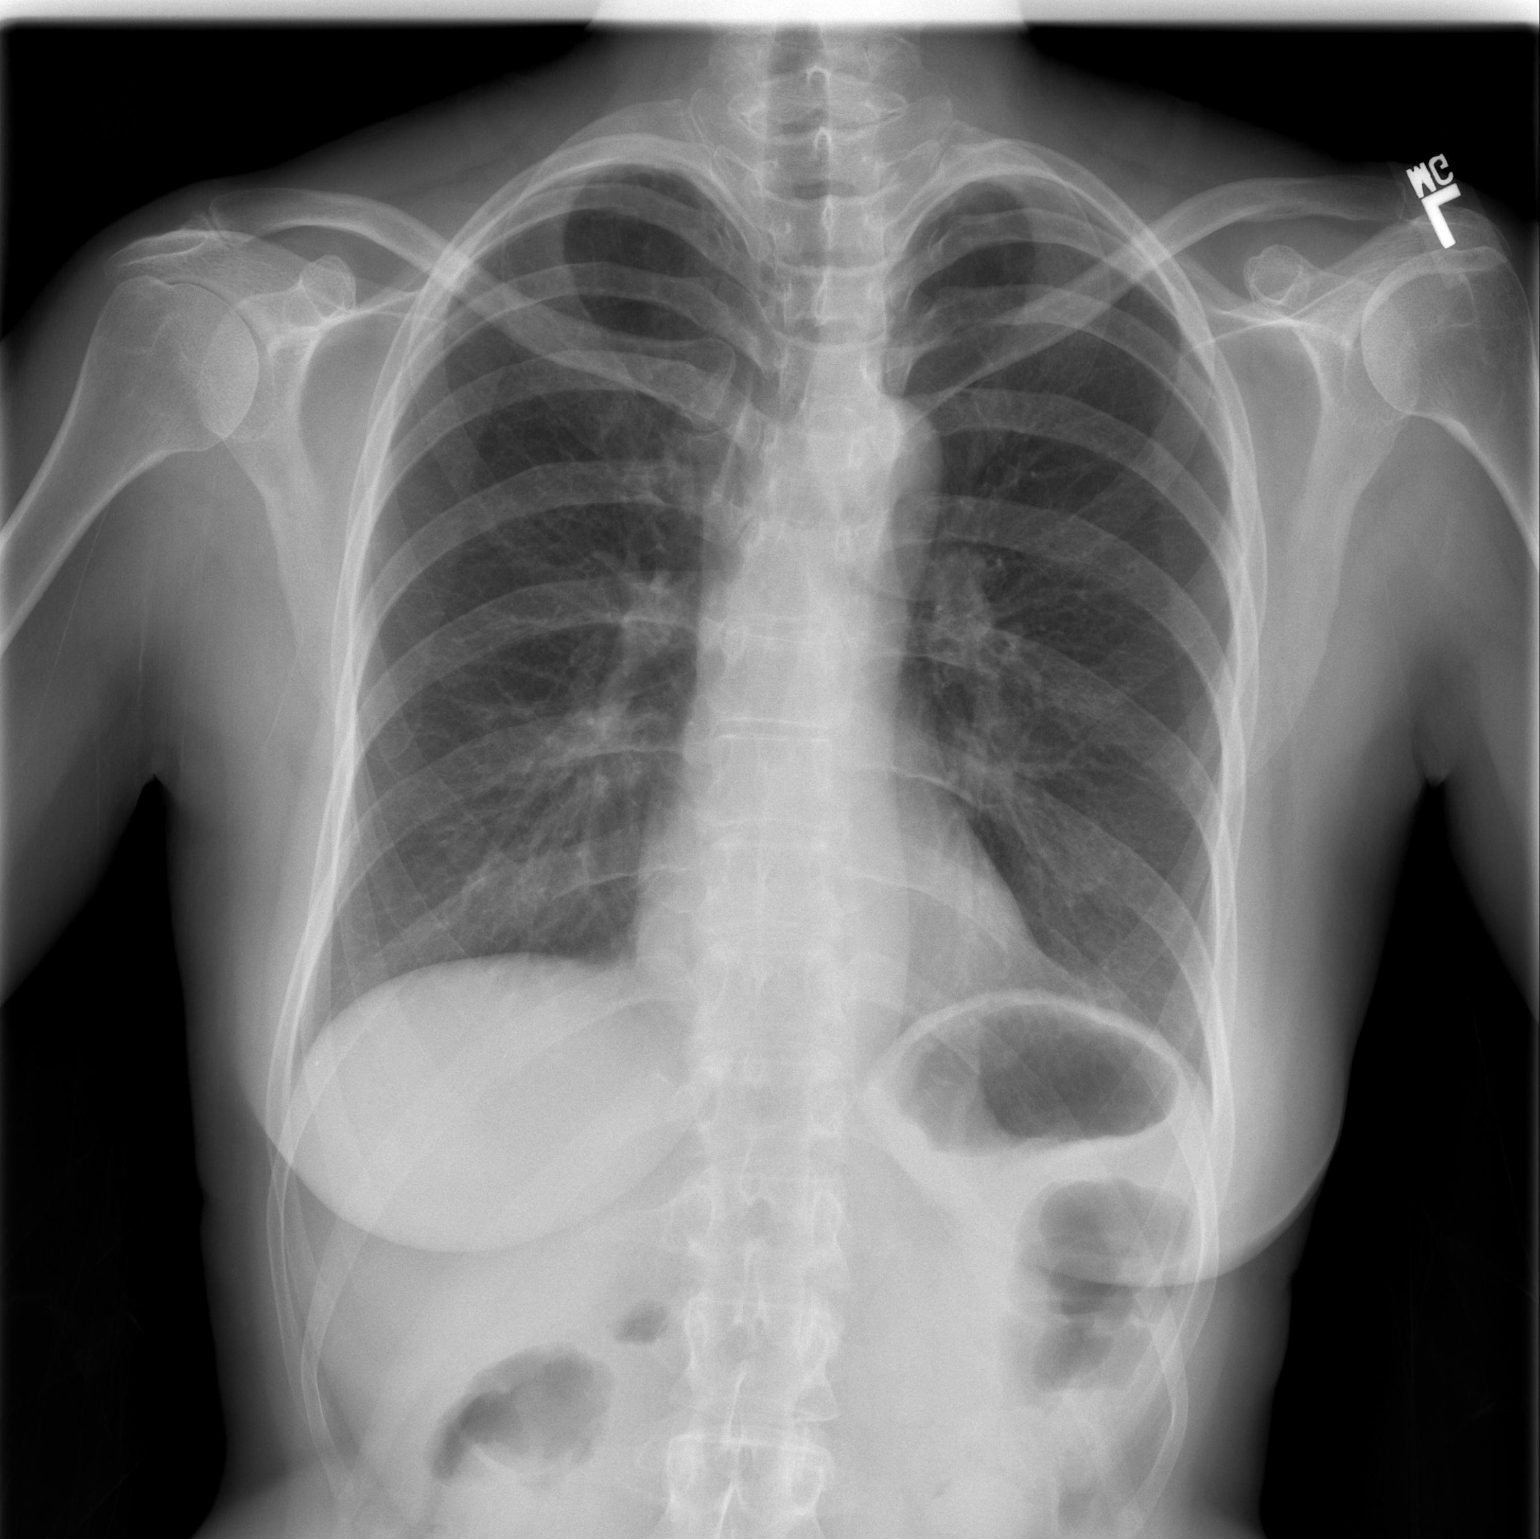

[w chest lat]
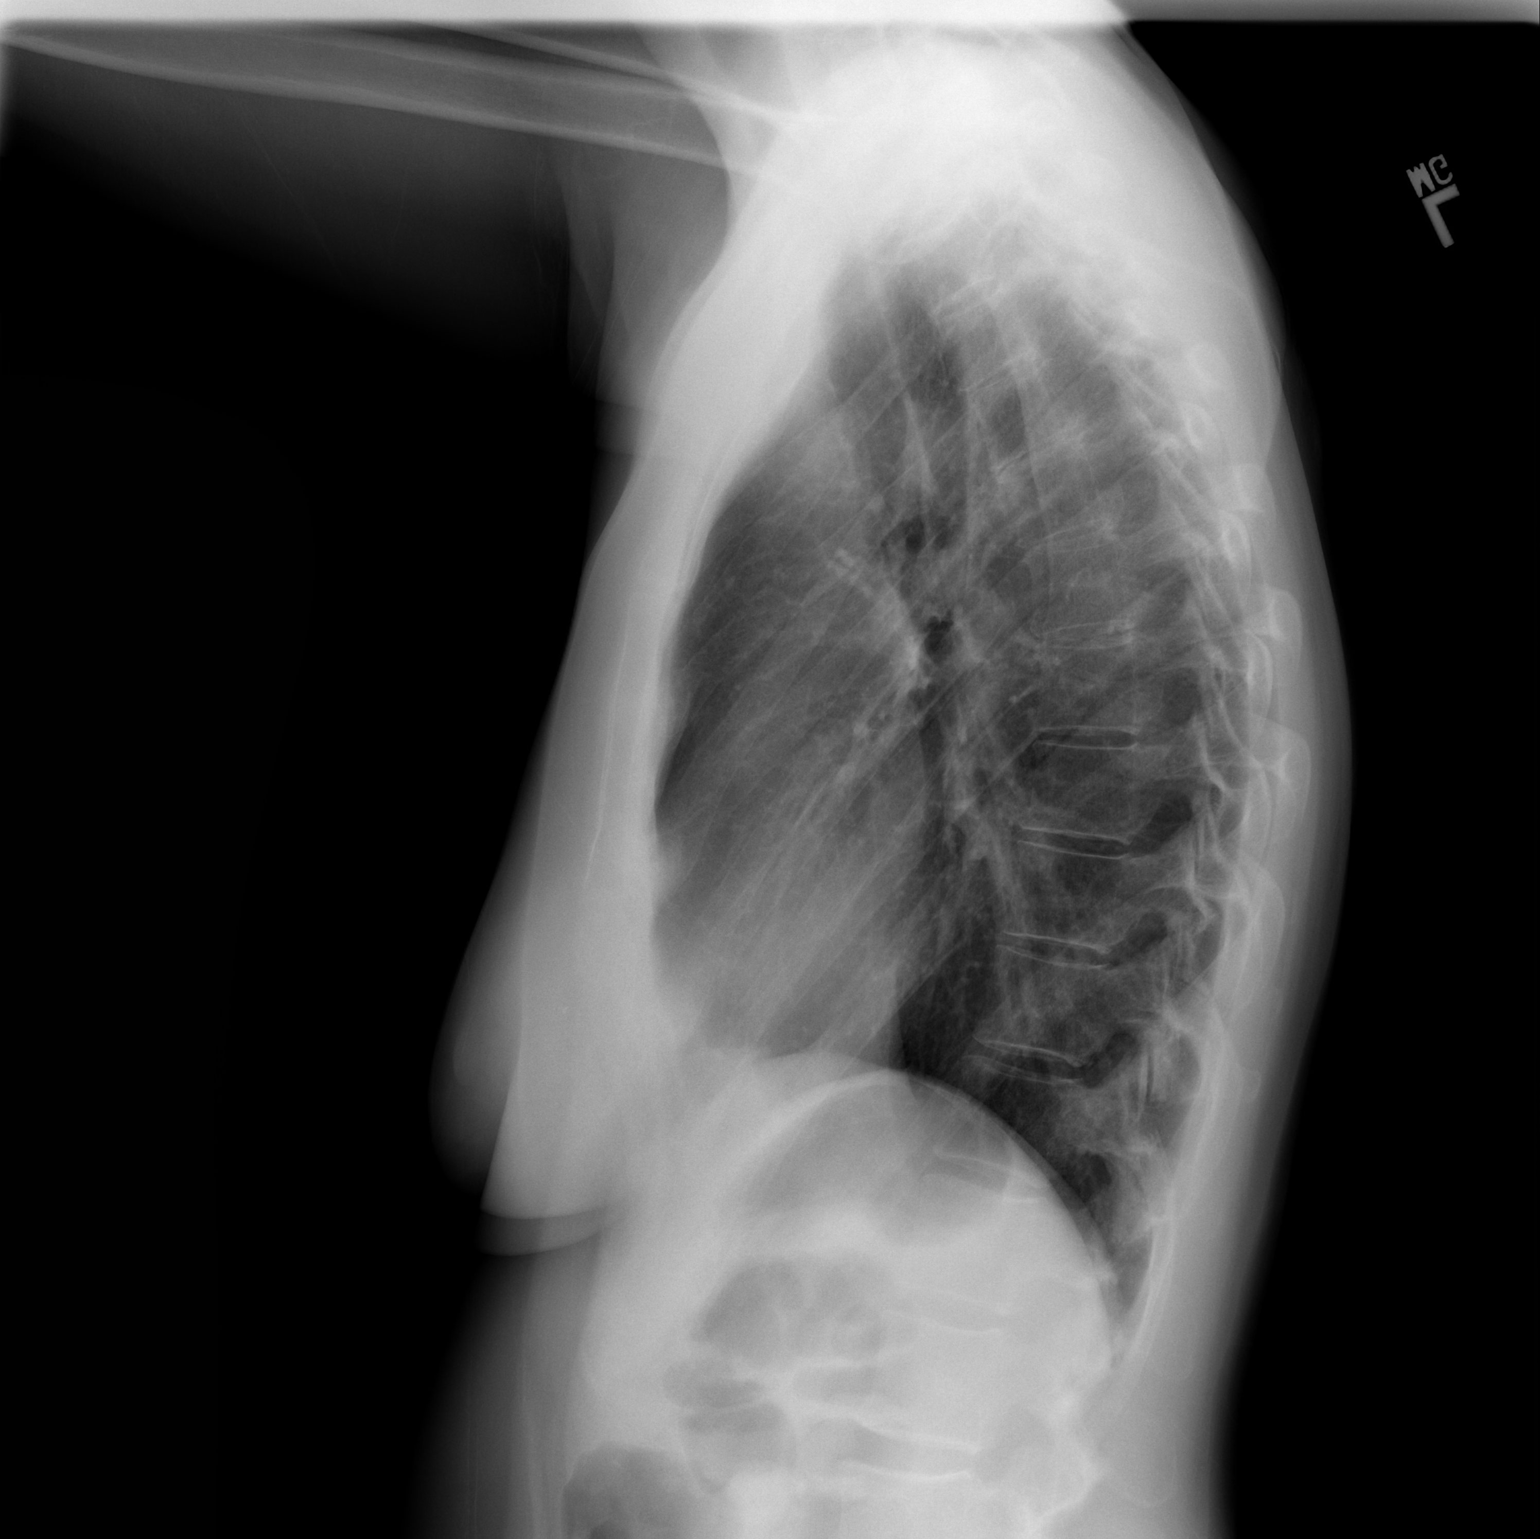

[2 of 2 positions shown; findings below may reference images not displayed]

FINDINGS: The lungs are clear.  Mediastinal contours appear normal.
The heart is within normal limits in size.  No bony abnormality is
seen.
IMPRESSION: No active lung disease.

## 2014-03-05 ENCOUNTER — Other Ambulatory Visit: Payer: Self-pay | Admitting: Gynecology

## 2014-03-07 LAB — CYTOLOGY - PAP

## 2014-05-30 ENCOUNTER — Encounter (HOSPITAL_COMMUNITY)
Admission: EM | Disposition: A | Discharge status: Home / Self Care | Payer: Self-pay | Source: Home / Self Care | Attending: Emergency Medicine

## 2014-05-30 ENCOUNTER — Encounter (HOSPITAL_BASED_OUTPATIENT_CLINIC_OR_DEPARTMENT_OTHER): Payer: Self-pay | Admitting: *Deleted

## 2014-05-30 ENCOUNTER — Observation Stay (HOSPITAL_BASED_OUTPATIENT_CLINIC_OR_DEPARTMENT_OTHER)
Admission: EM | Admit: 2014-05-30 | Discharge: 2014-05-31 | Disposition: A | Discharge status: Home / Self Care | Payer: 59 | Attending: Urology | Admitting: Urology

## 2014-05-30 ENCOUNTER — Emergency Department (HOSPITAL_BASED_OUTPATIENT_CLINIC_OR_DEPARTMENT_OTHER): Payer: 59

## 2014-05-30 ENCOUNTER — Emergency Department (HOSPITAL_COMMUNITY): Payer: 59 | Admitting: Anesthesiology

## 2014-05-30 DIAGNOSIS — K5790 Diverticulosis of intestine, part unspecified, without perforation or abscess without bleeding: Secondary | ICD-10-CM | POA: Diagnosis not present

## 2014-05-30 DIAGNOSIS — N390 Urinary tract infection, site not specified: Secondary | ICD-10-CM | POA: Diagnosis not present

## 2014-05-30 DIAGNOSIS — R51 Headache: Secondary | ICD-10-CM | POA: Insufficient documentation

## 2014-05-30 DIAGNOSIS — Z791 Long term (current) use of non-steroidal anti-inflammatories (NSAID): Secondary | ICD-10-CM | POA: Diagnosis not present

## 2014-05-30 DIAGNOSIS — I341 Nonrheumatic mitral (valve) prolapse: Secondary | ICD-10-CM | POA: Insufficient documentation

## 2014-05-30 DIAGNOSIS — Z87442 Personal history of urinary calculi: Secondary | ICD-10-CM | POA: Insufficient documentation

## 2014-05-30 DIAGNOSIS — Z79899 Other long term (current) drug therapy: Secondary | ICD-10-CM | POA: Insufficient documentation

## 2014-05-30 DIAGNOSIS — N200 Calculus of kidney: Secondary | ICD-10-CM

## 2014-05-30 DIAGNOSIS — N132 Hydronephrosis with renal and ureteral calculous obstruction: Principal | ICD-10-CM | POA: Insufficient documentation

## 2014-05-30 DIAGNOSIS — K219 Gastro-esophageal reflux disease without esophagitis: Secondary | ICD-10-CM | POA: Insufficient documentation

## 2014-05-30 DIAGNOSIS — N201 Calculus of ureter: Secondary | ICD-10-CM | POA: Diagnosis present

## 2014-05-30 DIAGNOSIS — R1031 Right lower quadrant pain: Secondary | ICD-10-CM | POA: Diagnosis present

## 2014-05-30 HISTORY — DX: Pituitary-dependent Cushing's disease: E24.0

## 2014-05-30 HISTORY — PX: CYSTOSCOPY: SHX5120

## 2014-05-30 LAB — LIPASE, BLOOD: LIPASE: 31 U/L (ref 11–59)

## 2014-05-30 LAB — COMPREHENSIVE METABOLIC PANEL
ALT: 17 U/L (ref 0–35)
AST: 20 U/L (ref 0–37)
Albumin: 4.2 g/dL (ref 3.5–5.2)
Alkaline Phosphatase: 66 U/L (ref 39–117)
Anion gap: 5 (ref 5–15)
BUN: 17 mg/dL (ref 6–23)
CO2: 28 mmol/L (ref 19–32)
CREATININE: 0.88 mg/dL (ref 0.50–1.10)
Calcium: 9 mg/dL (ref 8.4–10.5)
Chloride: 107 mmol/L (ref 96–112)
GFR calc non Af Amer: 74 mL/min — ABNORMAL LOW (ref >90)
GFR, EST AFRICAN AMERICAN: 85 mL/min — AB (ref >90)
GLUCOSE: 113 mg/dL — AB (ref 70–99)
Potassium: 3.1 mmol/L — ABNORMAL LOW (ref 3.5–5.1)
Sodium: 140 mmol/L (ref 135–145)
Total Bilirubin: 0.4 mg/dL (ref 0.3–1.2)
Total Protein: 7.1 g/dL (ref 6.0–8.3)

## 2014-05-30 LAB — URINALYSIS, ROUTINE W REFLEX MICROSCOPIC
GLUCOSE, UA: NEGATIVE mg/dL
Ketones, ur: 15 mg/dL — AB
NITRITE: POSITIVE — AB
Protein, ur: 100 mg/dL — AB
SPECIFIC GRAVITY, URINE: 1.027 (ref 1.005–1.030)
UROBILINOGEN UA: 0.2 mg/dL (ref 0.0–1.0)
pH: 5 (ref 5.0–8.0)

## 2014-05-30 LAB — URINE MICROSCOPIC-ADD ON

## 2014-05-30 LAB — CBC
HEMATOCRIT: 40.7 % (ref 36.0–46.0)
Hemoglobin: 13.9 g/dL (ref 12.0–15.0)
MCH: 32.1 pg (ref 26.0–34.0)
MCHC: 34.2 g/dL (ref 30.0–36.0)
MCV: 94 fL (ref 78.0–100.0)
PLATELETS: 243 10*3/uL (ref 150–400)
RBC: 4.33 MIL/uL (ref 3.87–5.11)
RDW: 12.6 % (ref 11.5–15.5)
WBC: 5.4 10*3/uL (ref 4.0–10.5)

## 2014-05-30 LAB — PREGNANCY, URINE: Preg Test, Ur: NEGATIVE

## 2014-05-30 SURGERY — CYSTOSCOPY
Anesthesia: General | Site: Ureter | Laterality: Left

## 2014-05-30 MED ORDER — MORPHINE SULFATE 4 MG/ML IJ SOLN
INTRAMUSCULAR | Status: AC
  Filled 2014-05-30: qty 1

## 2014-05-30 MED ORDER — SODIUM CHLORIDE 0.9 % IV BOLUS (SEPSIS)
1000.0000 mL | Freq: Once | INTRAVENOUS | Status: AC
  Administered 2014-05-30: 1000 mL via INTRAVENOUS

## 2014-05-30 MED ORDER — FENTANYL CITRATE (PF) 100 MCG/2ML IJ SOLN
INTRAMUSCULAR | Status: DC | PRN
  Administered 2014-05-30: 100 ug via INTRAVENOUS

## 2014-05-30 MED ORDER — MORPHINE SULFATE 2 MG/ML IJ SOLN
2.0000 mg | Freq: Once | INTRAMUSCULAR | Status: AC
  Administered 2014-05-30: 2 mg via INTRAVENOUS
  Filled 2014-05-30: qty 1

## 2014-05-30 MED ORDER — ONDANSETRON HCL 4 MG/2ML IJ SOLN
4.0000 mg | INTRAMUSCULAR | Status: DC | PRN
  Administered 2014-05-30: 4 mg via INTRAVENOUS

## 2014-05-30 MED ORDER — KETOROLAC TROMETHAMINE 30 MG/ML IJ SOLN
INTRAMUSCULAR | Status: AC
  Filled 2014-05-30: qty 1

## 2014-05-30 MED ORDER — KCL IN DEXTROSE-NACL 20-5-0.45 MEQ/L-%-% IV SOLN
INTRAVENOUS | Status: DC
  Administered 2014-05-31: 01:00:00 via INTRAVENOUS
  Filled 2014-05-30 (×2): qty 1000

## 2014-05-30 MED ORDER — CEFTRIAXONE SODIUM 1 G IJ SOLR
1.0000 g | Freq: Once | INTRAMUSCULAR | Status: AC
  Administered 2014-05-30: 1 g via INTRAVENOUS

## 2014-05-30 MED ORDER — MIDAZOLAM HCL 5 MG/5ML IJ SOLN
INTRAMUSCULAR | Status: DC | PRN
  Administered 2014-05-30: 2 mg via INTRAVENOUS

## 2014-05-30 MED ORDER — IOHEXOL 300 MG/ML  SOLN
INTRAMUSCULAR | Status: DC | PRN
  Administered 2014-05-30: 4 mL

## 2014-05-30 MED ORDER — BELLADONNA ALKALOIDS-OPIUM 16.2-60 MG RE SUPP
RECTAL | Status: AC
  Filled 2014-05-30: qty 1

## 2014-05-30 MED ORDER — SODIUM CHLORIDE 0.9 % IR SOLN
Status: DC | PRN
  Administered 2014-05-30: 3000 mL

## 2014-05-30 MED ORDER — PHENYLEPHRINE HCL 10 MG/ML IJ SOLN
INTRAMUSCULAR | Status: DC | PRN
  Administered 2014-05-30: 40 ug via INTRAVENOUS

## 2014-05-30 MED ORDER — HYDROMORPHONE HCL 1 MG/ML IJ SOLN
0.5000 mg | INTRAMUSCULAR | Status: DC | PRN
  Administered 2014-05-31 (×2): 1 mg via INTRAVENOUS
  Filled 2014-05-30 (×2): qty 1

## 2014-05-30 MED ORDER — DOCUSATE SODIUM 100 MG PO CAPS
100.0000 mg | ORAL_CAPSULE | Freq: Two times a day (BID) | ORAL | Status: DC
  Administered 2014-05-31: 100 mg via ORAL
  Filled 2014-05-30: qty 1

## 2014-05-30 MED ORDER — 0.9 % SODIUM CHLORIDE (POUR BTL) OPTIME
TOPICAL | Status: DC | PRN
  Administered 2014-05-30: 1000 mL

## 2014-05-30 MED ORDER — LACTATED RINGERS IV SOLN
INTRAVENOUS | Status: DC | PRN
  Administered 2014-05-30: 22:00:00 via INTRAVENOUS

## 2014-05-30 MED ORDER — LIDOCAINE HCL (CARDIAC) 20 MG/ML IV SOLN
INTRAVENOUS | Status: DC | PRN
  Administered 2014-05-30: 50 mg via INTRAVENOUS

## 2014-05-30 MED ORDER — FENTANYL CITRATE (PF) 100 MCG/2ML IJ SOLN
INTRAMUSCULAR | Status: AC
  Filled 2014-05-30: qty 2

## 2014-05-30 MED ORDER — PROPOFOL 10 MG/ML IV BOLUS
INTRAVENOUS | Status: AC
  Filled 2014-05-30: qty 20

## 2014-05-30 MED ORDER — DEXAMETHASONE SODIUM PHOSPHATE 10 MG/ML IJ SOLN
INTRAMUSCULAR | Status: AC
  Filled 2014-05-30: qty 1

## 2014-05-30 MED ORDER — LIDOCAINE HCL (CARDIAC) 20 MG/ML IV SOLN
INTRAVENOUS | Status: AC
  Filled 2014-05-30: qty 5

## 2014-05-30 MED ORDER — ONDANSETRON HCL 4 MG/2ML IJ SOLN
4.0000 mg | Freq: Once | INTRAMUSCULAR | Status: AC
  Administered 2014-05-30: 4 mg via INTRAVENOUS
  Filled 2014-05-30: qty 2

## 2014-05-30 MED ORDER — LACTATED RINGERS IV SOLN: INTRAVENOUS | Status: DC

## 2014-05-30 MED ORDER — LEVOFLOXACIN IN D5W 500 MG/100ML IV SOLN
500.0000 mg | Freq: Once | INTRAVENOUS | Status: AC
  Administered 2014-05-30: 500 mg via INTRAVENOUS
  Filled 2014-05-30: qty 100

## 2014-05-30 MED ORDER — LIDOCAINE HCL 2 % EX GEL
CUTANEOUS | Status: AC
  Filled 2014-05-30: qty 10

## 2014-05-30 MED ORDER — MIDAZOLAM HCL 2 MG/2ML IJ SOLN
INTRAMUSCULAR | Status: AC
  Filled 2014-05-30: qty 2

## 2014-05-30 MED ORDER — SCOPOLAMINE 1 MG/3DAYS TD PT72
MEDICATED_PATCH | TRANSDERMAL | Status: AC
  Filled 2014-05-30: qty 1

## 2014-05-30 MED ORDER — FENTANYL CITRATE (PF) 100 MCG/2ML IJ SOLN: 25.0000 ug | INTRAMUSCULAR | Status: DC | PRN

## 2014-05-30 MED ORDER — OXYCODONE-ACETAMINOPHEN 5-325 MG PO TABS
1.0000 | ORAL_TABLET | ORAL | Status: DC | PRN
Start: 2014-05-30 — End: 2014-05-31
  Administered 2014-05-31 (×2): 2 via ORAL
  Filled 2014-05-30 (×2): qty 2

## 2014-05-30 MED ORDER — SENNA 8.6 MG PO TABS
1.0000 | ORAL_TABLET | Freq: Two times a day (BID) | ORAL | Status: DC
  Administered 2014-05-31: 8.6 mg via ORAL
  Filled 2014-05-30: qty 1

## 2014-05-30 MED ORDER — DEXAMETHASONE SODIUM PHOSPHATE 10 MG/ML IJ SOLN
INTRAMUSCULAR | Status: DC | PRN
  Administered 2014-05-30: 10 mg via INTRAVENOUS

## 2014-05-30 MED ORDER — SODIUM CHLORIDE 0.9 % IV SOLN
INTRAVENOUS | Status: DC
  Administered 2014-05-30: 17:00:00 via INTRAVENOUS

## 2014-05-30 MED ORDER — CEFTRIAXONE SODIUM 1 G IJ SOLR
INTRAMUSCULAR | Status: AC
  Filled 2014-05-30: qty 10

## 2014-05-30 MED ORDER — MORPHINE SULFATE 4 MG/ML IJ SOLN
4.0000 mg | Freq: Once | INTRAMUSCULAR | Status: AC
  Administered 2014-05-30: 4 mg via INTRAVENOUS

## 2014-05-30 MED ORDER — PROPOFOL 10 MG/ML IV BOLUS
INTRAVENOUS | Status: DC | PRN
  Administered 2014-05-30: 100 mg via INTRAVENOUS

## 2014-05-30 MED ORDER — ONDANSETRON HCL 4 MG/2ML IJ SOLN
INTRAMUSCULAR | Status: AC
  Filled 2014-05-30: qty 2

## 2014-05-30 MED ORDER — KETOROLAC TROMETHAMINE 30 MG/ML IJ SOLN
30.0000 mg | Freq: Once | INTRAMUSCULAR | Status: AC | PRN
  Administered 2014-05-30: 30 mg via INTRAVENOUS

## 2014-05-30 MED ORDER — PROMETHAZINE HCL 25 MG/ML IJ SOLN
INTRAMUSCULAR | Status: AC
  Filled 2014-05-30: qty 1

## 2014-05-30 MED ORDER — METOCLOPRAMIDE HCL 5 MG/ML IJ SOLN: 10.0000 mg | Freq: Once | INTRAMUSCULAR | Status: DC | PRN

## 2014-05-30 MED ORDER — MEPERIDINE HCL 50 MG/ML IJ SOLN: 6.2500 mg | INTRAMUSCULAR | Status: DC | PRN

## 2014-05-30 MED ORDER — PROMETHAZINE HCL 25 MG/ML IJ SOLN
12.5000 mg | Freq: Once | INTRAMUSCULAR | Status: AC
  Administered 2014-05-30: 12.5 mg via INTRAVENOUS

## 2014-05-30 SURGICAL SUPPLY — 15 items
BAG URO CATCHER STRL LF (DRAPE) ×2 IMPLANT
CATH INTERMIT  6FR 70CM (CATHETERS) ×1 IMPLANT
CLOTH BEACON ORANGE TIMEOUT ST (SAFETY) ×2 IMPLANT
GLOVE BIOGEL M STRL SZ7.5 (GLOVE) ×2 IMPLANT
GLOVE BIOGEL PI IND STRL 7.5 (GLOVE) IMPLANT
GLOVE BIOGEL PI INDICATOR 7.5 (GLOVE) ×1
GLOVE ECLIPSE 7.5 STRL STRAW (GLOVE) ×1 IMPLANT
GOWN STRL REUS W/TWL LRG LVL3 (GOWN DISPOSABLE) ×4 IMPLANT
GUIDEWIRE STR DUAL SENSOR (WIRE) ×1 IMPLANT
IV NS IRRIG 3000ML ARTHROMATIC (IV SOLUTION) ×1 IMPLANT
MANIFOLD NEPTUNE II (INSTRUMENTS) ×2 IMPLANT
NS IRRIG 1000ML POUR BTL (IV SOLUTION) ×1 IMPLANT
PACK CYSTO (CUSTOM PROCEDURE TRAY) ×2 IMPLANT
STENT POLARIS 5FRX24 (STENTS) ×1 IMPLANT
TUBING CONNECTING 10 (TUBING) ×2 IMPLANT

## 2014-05-30 NOTE — Anesthesia Preprocedure Evaluation (Signed)
Anesthesia Evaluation  Patient identified by MRN, date of birth, ID band Patient awake    Reviewed: Allergy & Precautions, NPO status , Patient's Chart, lab work & pertinent test results  History of Anesthesia Complications (+) PONV and history of anesthetic complications  Airway Mallampati: I  TM Distance: >3 FB Neck ROM: Full    Dental no notable dental hx. (+) Teeth Intact, Caps,    Pulmonary neg pulmonary ROS,  breath sounds clear to auscultation  Pulmonary exam normal       Cardiovascular negative cardio ROS  Rhythm:Regular Rate:Normal     Neuro/Psych  Headaches, negative psych ROS   GI/Hepatic Neg liver ROS, GERD-  Medicated and Controlled,  Endo/Other  negative endocrine ROS  Renal/GU Renal diseaseHx/o ureteral calculi  negative genitourinary   Musculoskeletal negative musculoskeletal ROS (+)   Abdominal   Peds  Hematology negative hematology ROS (+)   Anesthesia Other Findings   Reproductive/Obstetrics                             Anesthesia Physical Anesthesia Plan  ASA: II  Anesthesia Plan: General   Post-op Pain Management:    Induction: Intravenous  Airway Management Planned: LMA  Additional Equipment:   Intra-op Plan:   Post-operative Plan: Extubation in OR  Informed Consent: I have reviewed the patients History and Physical, chart, labs and discussed the procedure including the risks, benefits and alternatives for the proposed anesthesia with the patient or authorized representative who has indicated his/her understanding and acceptance.   Dental advisory given  Plan Discussed with: CRNA, Anesthesiologist and Surgeon  Anesthesia Plan Comments:         Anesthesia Quick Evaluation

## 2014-05-30 NOTE — ED Notes (Signed)
Bilateral flank pain and nausea that started today.

## 2014-05-30 NOTE — Brief Op Note (Signed)
05/30/2014  10:07 PM  PATIENT:  Margaret Carney  54 y.o. female  PRE-OPERATIVE DIAGNOSIS:  left ureteral stone  POST-OPERATIVE DIAGNOSIS:  * No post-op diagnosis entered *  PROCEDURE:  Procedure(s): CYSTOSCOPY/LEFT RETROGRADE PYELOGRAM/PLACEMENT URETERAL STENT (Left)  SURGEON:  Surgeon(s) and Role:    * Alexis Frock, MD - Primary  PHYSICIAN ASSISTANT:   ASSISTANTS: none   ANESTHESIA:   general  EBL:     BLOOD ADMINISTERED:none  DRAINS: none   LOCAL MEDICATIONS USED:  NONE  SPECIMEN:  No Specimen  DISPOSITION OF SPECIMEN:  N/A  COUNTS:  YES  TOURNIQUET:  * No tourniquets in log *  DICTATION: .Other Dictation: Dictation Number [id number]  PLAN OF CARE: Admit for overnight observation  PATIENT DISPOSITION:  PACU - hemodynamically stable.   Delay start of Pharmacological VTE agent (>24hrs) due to surgical blood loss or risk of bleeding: no

## 2014-05-30 NOTE — ED Notes (Signed)
Bed: WC37 Expected date:  Expected time:  Means of arrival:  Comments: carelink-transfer from [facility]

## 2014-05-30 NOTE — ED Provider Notes (Signed)
7:15 PM patient here transferred from [facility] emergency department to be evaluated by urologist. She was determined to have a 10 mm left-sided ureteral stone earlier today. Presently her pain is mild. No other complaint. She is declining pain medicine presently and appears comfortable  Orlie Dakin, MD 05/30/14 1919

## 2014-05-30 NOTE — ED Notes (Signed)
MD at bedside. 

## 2014-05-30 NOTE — Transfer of Care (Signed)
Immediate Anesthesia Transfer of Care Note  Patient: Margaret Carney  Procedure(s) Performed: Procedure(s): CYSTOSCOPY/LEFT RETROGRADE PYELOGRAM/PLACEMENT URETERAL STENT (Left)  Patient Location: PACU  Anesthesia Type:General  Level of Consciousness:  sedated, patient cooperative and responds to stimulation  Airway & Oxygen Therapy:Patient Spontanous Breathing and Patient connected to face mask oxgen  Post-op Assessment:  Report given to PACU RN and Post -op Vital signs reviewed and stable  Post vital signs:  Reviewed and stable  Last Vitals:  Filed Vitals:   05/30/14 2221  BP: 131/68  Pulse: 81  Temp: 37.1 C  Resp: 20    Complications: No apparent anesthesia complications

## 2014-05-30 NOTE — Anesthesia Procedure Notes (Signed)
Procedure Name: LMA Insertion Date/Time: 05/30/2014 9:55 PM Performed by: Anne Fu Pre-anesthesia Checklist: Patient identified, Emergency Drugs available, Suction available, Patient being monitored and Timeout performed Patient Re-evaluated:Patient Re-evaluated prior to inductionOxygen Delivery Method: Circle system utilized Preoxygenation: Pre-oxygenation with 100% oxygen Intubation Type: IV induction Ventilation: Mask ventilation without difficulty LMA: LMA inserted LMA Size: 4.0 Number of attempts: 1 Placement Confirmation: positive ETCO2 and breath sounds checked- equal and bilateral Tube secured with: Tape

## 2014-05-30 NOTE — Anesthesia Postprocedure Evaluation (Signed)
  Anesthesia Post-op Note  Patient: Margaret Carney  Procedure(s) Performed: Procedure(s): CYSTOSCOPY/LEFT RETROGRADE PYELOGRAM/PLACEMENT URETERAL STENT (Left)  Patient Location: PACU  Anesthesia Type:General  Level of Consciousness: awake, alert  and oriented  Airway and Oxygen Therapy: Patient Spontanous Breathing  Post-op Pain: none  Post-op Assessment: Post-op Vital signs reviewed, Patient's Cardiovascular Status Stable, Respiratory Function Stable, Patent Airway, No signs of Nausea or vomiting, Pain level controlled and No headache  Post-op Vital Signs: Reviewed and stable  Last Vitals:  Filed Vitals:   05/30/14 2245  BP: 123/75  Pulse: 55  Temp: 36.9 C  Resp: 11    Complications: No apparent anesthesia complications

## 2014-05-30 NOTE — ED Provider Notes (Signed)
CSN: [id number]     Arrival date & time 05/30/14  1450 History   First MD Initiated Contact with Patient 05/30/14 1507     Chief Complaint  Patient presents with  . Flank Pain     (Consider location/radiation/quality/duration/timing/severity/associated sxs/prior Treatment) HPI 1 week of pain starting with right lower quadrant pain. Onset aching and cramping in nature. Has continued through the week and worsened with increasing lower back pain associated. At onset patient thought she might have had fever and chills. She has not documented this through the week. Today nausea and rusty-colored urine. Small amounts of bowel movement without blood but whitish material with it. No diarrhea. Prior surgical history is for a laparoscopy for endometriosis. The patient was seen last week at an urgent care and diagnosed with a viral type of colitis. She has been mostly taking liquids throughout the week. Symptoms are not improving. Now she is predominantly noticing pain in the back and somewhat diffusely in the abdomen more to the left than right and suprapubic. She reports that the pain is worsening. She reports she has had a kidney stone that she had to have lithotripsy done on about a year ago by Dr. Junious Silk. Past Medical History  Diagnosis Date  . MVP (mitral valve prolapse)   . Kidney stone on right side   . Allergy     seasonal  . Osteopenia   . Heart murmur   . GERD (gastroesophageal reflux disease)   . Diverticulosis 2013  . Internal and external hemorrhoids without complication 4627  . Hx of colonic polyps 2013    Tubular Adenoma 2 mm rectal  . Cushing's disease    Past Surgical History  Procedure Laterality Date  . Colonoscopy w/ biopsies  2013  . Pitituary tumor  1989  . Laparoscopic endometriosis fulguration  1989  . Tonsillectomy and adenoidectomy  1968  . Lithotripsy      Nephrolithiasis   Family History  Problem Relation Age of Onset  . Colon polyps Mother   . Colon cancer  Neg Hx   . Stomach cancer Neg Hx   . Ulcerative colitis Neg Hx   . Esophageal cancer Neg Hx   . Breast cancer Sister   . Diabetes Father     Living, 81  . Hypertension Mother     Living, 87  . Dementia Mother    History  Substance Use Topics  . Smoking status: Never Smoker   . Smokeless tobacco: Never Used  . Alcohol Use: No   OB History    No data available     Review of Systems  10 Systems reviewed and are negative for acute change except as noted in the HPI.   Allergies  Codeine   Home Medications   Prior to Admission medications   Medication Sig Start Date End Date Taking? Authorizing Provider  Calcium-Vitamin D-Vitamin K (CALCIUM SOFT CHEWS PO) Take by mouth.    Historical Provider, MD  ibuprofen (ADVIL,MOTRIN) 200 MG tablet Take 2 tablets (400 mg total) by mouth every 6 (six) hours as needed. For pain 06/16/11   Festus Aloe, MD  polyethylene glycol [facility] / Floria Raveling) packet Take 17 g by mouth daily with breakfast.     Historical Provider, MD  polyethylene glycol powder (GLYCOLAX/MIRALAX) powder Take 17 grams in 8 oz of juice or water. 11/28/13   Willia Craze, NP  SUMAtriptan (IMITREX) 50 MG tablet Take one tablet at headache onset, if no improvement, may repeat in 2 hours  if headache persists or recurs. 04/03/13   Donika K Patel, DO   BP 145/71 mmHg  Pulse 69  Temp(Src) 98.3 F (36.8 C) (Oral)  Resp 20  Ht 5\' 8"  (1.727 m)  Wt 140 lb (63.504 kg)  BMI 21.29 kg/m2  SpO2 100%  LMP 09/08/2010 Physical Exam  Constitutional: She is oriented to person, place, and time. She appears well-developed and well-nourished.  HENT:  Head: Normocephalic and atraumatic.  Eyes: EOM are normal. Pupils are equal, round, and reactive to light.  Neck: Neck supple.  Cardiovascular: Normal rate, regular rhythm, normal heart sounds and intact distal pulses.   Pulmonary/Chest: Effort normal and breath sounds normal.  Abdominal: Soft. Bowel sounds are normal. She exhibits no  distension. There is tenderness.  Right lower quadrant and suprapubic pain to palpation. No guarding.  Musculoskeletal: Normal range of motion. She exhibits no edema.  Neurological: She is alert and oriented to person, place, and time. She has normal strength. Coordination normal. GCS eye subscore is 4. GCS verbal subscore is 5. GCS motor subscore is 6.  Skin: Skin is warm, dry and intact.  Psychiatric: She has a normal mood and affect.    ED Course  Procedures (including critical care time) Labs Review Labs Reviewed  URINALYSIS, ROUTINE W REFLEX MICROSCOPIC - Abnormal; Notable for the following:    Color, Urine RED (*)    APPearance TURBID (*)    Hgb urine dipstick LARGE (*)    Bilirubin Urine MODERATE (*)    Ketones, ur 15 (*)    Protein, ur 100 (*)    Nitrite POSITIVE (*)    Leukocytes, UA MODERATE (*)    All other components within normal limits  COMPREHENSIVE METABOLIC PANEL - Abnormal; Notable for the following:    Potassium 3.1 (*)    Glucose, Bld 113 (*)    GFR calc non Af Amer 74 (*)    GFR calc Af Amer 85 (*)    All other components within normal limits  URINE MICROSCOPIC-ADD ON - Abnormal; Notable for the following:    Bacteria, UA MANY (*)    All other components within normal limits  CBC  LIPASE, BLOOD  PREGNANCY, URINE    Imaging Review Ct Renal Stone Study  05/30/2014   CLINICAL DATA:  54 year old female with bilateral flank pain since last week, gross hematuria. Initial encounter.  EXAM: CT ABDOMEN AND PELVIS WITHOUT CONTRAST  TECHNIQUE: Multidetector CT imaging of the abdomen and pelvis was performed following the standard protocol without IV contrast.  COMPARISON:  Abdomen KUB 05/25/2014 and earlier. CT Abdomen and Pelvis 06/15/2011.  FINDINGS: Minimal atelectasis at the right lung base is similar to the prior exam. Otherwise negative lung bases. No pericardial or pleural effusion.  No acute osseous abnormality identified.  No pelvic free fluid. Negative non  contrast uterus and adnexa. Decompressed distal colon.  Redundant but otherwise negative sigmoid colon. Negative left colon aside from trace fluid in the left gutter. See left renal findings below. Negative transverse colon. The cecum is in the midline of the pelvis. No dilated small bowel. Negative stomach and duodenum.  Negative non contrast liver, gallbladder, spleen, pancreas, and adrenal glands. No abdominal free fluid. No calcified atherosclerosis of the abdominal aorta. No lymphadenopathy identified.  Regressed right nephrolithiasis since 2013. Several 2-3 mm calculi now are present in the right kidney, mostly the lower pole. No right hydronephrosis or hydroureter. No periureteral stranding.  Left hydronephrosis, mild nephro megaly and perinephric stranding related to a  bulky 7 x 7 x 10 mm calculus lodged in the proximal left ureter about 2 cm distal to the left UPJ. Beyond this obstruction, the left ureter is decompressed to the bladder. The bladder is decompressed and unremarkable. There are occasional punctate stones within the left kidney (lower pole).  IMPRESSION: 1. Acute obstructive uropathy on the left with a bulky 10 mm calculus lodged in the proximal ureter about 2 cm distal to the left UPJ. 2. Bilateral nephrolithiasis, regressed on the right since 2013.   Electronically Signed   By: Genevie Ann M.D.   On: 05/30/2014 16:13     EKG Interpretation None     consult: Patient's case reviewed Dr. Bess Harvest of urology. At this time he advises the patient will need to be transferred to [facility] for consultation and evaluation of retained kidney stone with hydronephrosis  MDM   Final diagnoses:  Kidney stone  UTI (lower urinary tract infection)  Hydronephrosis with urinary obstruction due to renal calculus   The patient presents with a 10 mm retained kidney stone with increasing pain. Urine also shows nitrite and leuk esterase with many bacteria. The patient was started on Rocephin and Levaquin for  coverage of UTI with kidney stone.    Charlesetta Shanks, MD 05/30/14 [phone]

## 2014-05-30 NOTE — H&P (Signed)
Margaret Carney is an 54 y.o. female.    Chief Complaint: Left Ureteral Stone, Bilateral Renal Stones, Bacteruria  HPI:   1 - Left Ureteral Stone, Bilateral Renal Stones - left 70mm UPJ stone with moderate hydro by ER CT 4/29 on eval 5 days worseneing diffuse abd pain, back pain, and nausea. Additional bilateral 2-61mm renal stones. She had Rt SWL by Margaret Carney in 2014.   2 - Bacteruria - bacteruria on UA from ER. Admits to subjective and low-grade fevers on/off x 5 days.   PMH sig for pituitary tumor resection, tonsillectomy. Her PCP is Margaret Kass MD.   Today Margaret Carney is seen for above.   Past Medical History  Diagnosis Date  . MVP (mitral valve prolapse)   . Kidney stone on right side   . Allergy     seasonal  . Osteopenia   . Heart murmur   . GERD (gastroesophageal reflux disease)   . Diverticulosis 2013  . Internal and external hemorrhoids without complication 1540  . Hx of colonic polyps 2013    Tubular Adenoma 2 mm rectal  . Cushing's disease     Past Surgical History  Procedure Laterality Date  . Colonoscopy w/ biopsies  2013  . Pitituary tumor  1989  . Laparoscopic endometriosis fulguration  1989  . Tonsillectomy and adenoidectomy  1968  . Lithotripsy      Nephrolithiasis    Family History  Problem Relation Age of Onset  . Colon polyps Mother   . Colon cancer Neg Hx   . Stomach cancer Neg Hx   . Ulcerative colitis Neg Hx   . Esophageal cancer Neg Hx   . Breast cancer Sister   . Diabetes Father     Living, 53  . Hypertension Mother     Living, 52  . Dementia Mother    Social History:  reports that she has never smoked. She has never used smokeless tobacco. She reports that she does not drink alcohol or use illicit drugs.  Allergies:  Allergies  Allergen Reactions  . Codeine Itching     (Not in a hospital admission)  Results for orders placed or performed during the hospital encounter of 05/30/14 (from the past 48 hour(s))  Urinalysis, Routine w  reflex microscopic     Status: Abnormal   Collection Time: 05/30/14  3:20 PM  Result Value Ref Range   Color, Urine RED (A) YELLOW    Comment: BIOCHEMICALS MAY BE AFFECTED BY COLOR   APPearance TURBID (A) CLEAR   Specific Gravity, Urine 1.027 1.005 - 1.030   pH 5.0 5.0 - 8.0   Glucose, UA NEGATIVE NEGATIVE mg/dL   Hgb urine dipstick LARGE (A) NEGATIVE   Bilirubin Urine MODERATE (A) NEGATIVE   Ketones, ur 15 (A) NEGATIVE mg/dL   Protein, ur 100 (A) NEGATIVE mg/dL   Urobilinogen, UA 0.2 0.0 - 1.0 mg/dL   Nitrite POSITIVE (A) NEGATIVE   Leukocytes, UA MODERATE (A) NEGATIVE  CBC     Status: None   Collection Time: 05/30/14  3:20 PM  Result Value Ref Range   WBC 5.4 4.0 - 10.5 K/uL   RBC 4.33 3.87 - 5.11 MIL/uL   Hemoglobin 13.9 12.0 - 15.0 g/dL   HCT 40.7 36.0 - 46.0 %   MCV 94.0 78.0 - 100.0 fL   MCH 32.1 26.0 - 34.0 pg   MCHC 34.2 30.0 - 36.0 g/dL   RDW 12.6 11.5 - 15.5 %   Platelets 243 150 - 400 K/uL  Comprehensive metabolic panel     Status: Abnormal   Collection Time: 05/30/14  3:20 PM  Result Value Ref Range   Sodium 140 135 - 145 mmol/L   Potassium 3.1 (L) 3.5 - 5.1 mmol/L   Chloride 107 96 - 112 mmol/L   CO2 28 19 - 32 mmol/L   Glucose, Bld 113 (H) 70 - 99 mg/dL   BUN 17 6 - 23 mg/dL   Creatinine, Ser 0.88 0.50 - 1.10 mg/dL   Calcium 9.0 8.4 - 10.5 mg/dL   Total Protein 7.1 6.0 - 8.3 g/dL   Albumin 4.2 3.5 - 5.2 g/dL   AST 20 0 - 37 U/L   ALT 17 0 - 35 U/L   Alkaline Phosphatase 66 39 - 117 U/L   Total Bilirubin 0.4 0.3 - 1.2 mg/dL   GFR calc non Af Amer 74 (L) >90 mL/min   GFR calc Af Amer 85 (L) >90 mL/min    Comment: (NOTE) The eGFR has been calculated using the CKD EPI equation. This calculation has not been validated in all clinical situations. eGFR's persistently <90 mL/min signify possible Chronic Kidney Disease.    Anion gap 5 5 - 15  Lipase, blood     Status: None   Collection Time: 05/30/14  3:20 PM  Result Value Ref Range   Lipase 31 11 - 59  U/L  Pregnancy, urine     Status: None   Collection Time: 05/30/14  3:20 PM  Result Value Ref Range   Preg Test, Ur NEGATIVE NEGATIVE    Comment:        THE SENSITIVITY OF THIS METHODOLOGY IS >20 mIU/mL.   Urine microscopic-add on     Status: Abnormal   Collection Time: 05/30/14  3:20 PM  Result Value Ref Range   Squamous Epithelial / LPF RARE RARE   WBC, UA 3-6 <3 WBC/hpf   RBC / HPF TOO NUMEROUS TO COUNT <3 RBC/hpf   Bacteria, UA MANY (A) RARE   Urine-Other MUCOUS PRESENT    Ct Renal Stone Study  05/30/2014   CLINICAL DATA:  54 year old female with bilateral flank pain since last week, gross hematuria. Initial encounter.  EXAM: CT ABDOMEN AND PELVIS WITHOUT CONTRAST  TECHNIQUE: Multidetector CT imaging of the abdomen and pelvis was performed following the standard protocol without IV contrast.  COMPARISON:  Abdomen KUB 05/25/2014 and earlier. CT Abdomen and Pelvis 06/15/2011.  FINDINGS: Minimal atelectasis at the right lung base is similar to the prior exam. Otherwise negative lung bases. No pericardial or pleural effusion.  No acute osseous abnormality identified.  No pelvic free fluid. Negative non contrast uterus and adnexa. Decompressed distal colon.  Redundant but otherwise negative sigmoid colon. Negative left colon aside from trace fluid in the left gutter. See left renal findings below. Negative transverse colon. The cecum is in the midline of the pelvis. No dilated small bowel. Negative stomach and duodenum.  Negative non contrast liver, gallbladder, spleen, pancreas, and adrenal glands. No abdominal free fluid. No calcified atherosclerosis of the abdominal aorta. No lymphadenopathy identified.  Regressed right nephrolithiasis since 2013. Several 2-3 mm calculi now are present in the right kidney, mostly the lower pole. No right hydronephrosis or hydroureter. No periureteral stranding.  Left hydronephrosis, mild nephro megaly and perinephric stranding related to a bulky 7 x 7 x 10 mm  calculus lodged in the proximal left ureter about 2 cm distal to the left UPJ. Beyond this obstruction, the left ureter is decompressed to the bladder.  The bladder is decompressed and unremarkable. There are occasional punctate stones within the left kidney (lower pole).  IMPRESSION: 1. Acute obstructive uropathy on the left with a bulky 10 mm calculus lodged in the proximal ureter about 2 cm distal to the left UPJ. 2. Bilateral nephrolithiasis, regressed on the right since 2013.   Electronically Signed   By: Genevie Ann M.D.   On: 05/30/2014 16:13    Review of Systems  Constitutional: Positive for fever and malaise/fatigue.  HENT: Negative.   Eyes: Negative.   Respiratory: Negative.   Cardiovascular: Negative.   Gastrointestinal: Positive for nausea.  Genitourinary: Positive for flank pain.  Skin: Negative.   Neurological: Negative.   Endo/Heme/Allergies: Negative.   Psychiatric/Behavioral: Negative.     Blood pressure 108/66, pulse 77, temperature 98.9 F (37.2 C), temperature source Oral, resp. rate 17, height $RemoveBe'5\' 8"'bPWyiGAKn$  (1.727 m), weight 63.504 kg (140 lb), last menstrual period 09/08/2010, SpO2 97 %. Physical Exam  Constitutional: She appears well-developed.  HENT:  Head: Normocephalic.  Eyes: Pupils are equal, round, and reactive to light.  Neck: Normal range of motion.  Cardiovascular: Normal rate.   Respiratory: Effort normal.  GI: Soft.  Genitourinary:  Very mild left CVAT  Musculoskeletal: Normal range of motion.  Neurological: She is alert.  Skin: Skin is warm.  Psychiatric: She has a normal mood and affect. Her behavior is normal. Judgment and thought content normal.     Assessment/Plan  1 - Left Ureteral Stone, Bilateral Renal Stones - as some bacteruria and subjective fevers, rec renal decompression with ureteral stent today followed by definitive stone management (URS v. SWL) in elective setting. Observe overnight to verify no sig infectious parameters develop. Risks,  benefits, alternatives discussed.   2 - Bacteruria - UCX pending, received levaquin earlier today. Will proceed with renal decompression as per above to lessen chance of progression to pyelo / sepsis.    Mckenize Mezera 05/30/2014, 8:21 PM

## 2014-05-31 MED ORDER — OXYCODONE-ACETAMINOPHEN 5-325 MG PO TABS: 1.0000 | ORAL_TABLET | Freq: Three times a day (TID) | ORAL | Status: DC | PRN

## 2014-05-31 MED ORDER — SENNOSIDES-DOCUSATE SODIUM 8.6-50 MG PO TABS: 1.0000 | ORAL_TABLET | Freq: Two times a day (BID) | ORAL | Status: DC

## 2014-05-31 NOTE — Discharge Instructions (Signed)
1 - You may have urinary urgency (bladder spasms) and bloody urine on / off with stent in place. This is normal. ° °2 - Call MD or go to ER for fever >102, severe pain / nausea / vomiting not relieved by medications, or acute change in medical status ° °

## 2014-05-31 NOTE — Care Management (Signed)
Utilization Review completed.  

## 2014-05-31 NOTE — Op Note (Signed)
NAMEBRIONNE, Carney                ACCOUNT NO.:  [id number]  MEDICAL RECORD NO.:  [id number]  LOCATION:  65                         FACILITY:  [facility]  PHYSICIAN:  Alexis Frock, MD     DATE OF BIRTH:  [birth year]  DATE OF PROCEDURE: 05/30/2014                                OPERATIVE REPORT   PREOPERATIVE DIAGNOSIS:  Left ureteral stone, hydronephrosis, and bacteruria.  PROCEDURE: 1. Cystoscopy with left retrograde pyelogram and interpretation. 2. Left ureteral stent placement, 5 x 24 Polaris, no tether.  ESTIMATED BLOOD LOSS:  Nil.  COMPLICATIONS:  None.  SPECIMENS:  None.  FINDINGS: 1. Mild left hydronephrosis with filling defects detected in UPJ     consistent with known stone. 2. Placement of left ureteral stent, proximal end renal pelvis, distal     end urinary bladder. 3. Unremarkable urinary bladder.  INDICATION:  Margaret Carney is a very pleasant 54 year old lady with history of prior nephrolithiasis who presented with a several day prodrome of colicky abdominal pain as well as low-grade fevers.  She was found on workup with [place] to have a left proximal ureteral stone with hydronephrosis and bacteruria.  [facility] evaluation [facility] performed [facility] [place].  I discussed with the ER physician area of these findings and given the bacteruria and obstructing stone, it was felt that renal decompression was clearly warranted.  The patient was transferred to [facility] Emergency Room.  She was seen and evaluated, and options were discussed, and she wished to proceed with a renal decompression with ureteral stenting.  Informed consent was obtained and placed in medical record.  DESCRIPTION OF PROCEDURE:  The patient being Margaret Carney was verified. Procedure being left ureteral stent placement was confirmed.  Procedure was carried out.  Time-out was performed.  Intravenous antibiotics were administered.  General LMA anesthesia was introduced.  The patient was placed  into a low lithotomy position.  Sterile field was created by prepping and draping the patient's vagina, introitus, and proximal thighs using iodine x3.  Next, cystourethroscopy was performed using a 23-French rigid scope with 30-degree offset lens.  Inspection of bladder revealed no diverticula, calcifications, or papular lesions.  The left ureteral orifice was cannulated with a 6-French Foley catheter and left retrograde pyelogram was obtained.  Left retrograde pyelogram demonstrated a single left ureter, single system left kidney.  There was a filling defect in the proximal ureter. In the area of the UVJ, there was mild hydronephrosis.  Above this, a 0.038 zip wire was advanced to level of the upper pole, over which a new 5 x 24 Polaris-type stent was placed using cystoscopic and fluoroscopic guidance.  Good proximal and distal deployment were noted.  Efflux of urine was seen around and through the distal end of the stent.  It did not appear to be grossly purulent.  Bladder was emptied per cystoscope. Procedure was then terminated.  The patient tolerated the procedure well.  There were no immediate periprocedural complications.  The patient was taken to postanesthesia care unit in stable condition with plan for observation admission overnight.          ______________________________ Alexis Frock, MD     TM/MEDQ  D:  05/30/2014  T:  05/31/2014  Job:  [id number]

## 2014-05-31 NOTE — Discharge Summary (Signed)
Physician Discharge Summary  Patient ID: Margaret Carney MRN: [id number] DOB/AGE: 54/26/1962 54 y.o.  Admit date: 05/30/2014 Discharge date: 05/31/2014  Admission Diagnoses: Left Ureteral Stone, bacteruria  Discharge Diagnoses:  Active Problems:   Left ureteral stone   Discharged Condition: good  Hospital Course:   1 - Left Ureteral Stone, Bilateral Renal Stones - s/p cysto and left ureteral stent placemetn (5x25 polaris) 4/29 the day of admission for left 10mm UPJ stone with moderate hydro by ER CT 4/29 on eval 5 days worseneing diffuse abd pain, back pain, and nausea.   2 - Bacteruria - bacteruria on UA from ER. Admits to subjective and low-grade fevers on/off x 5 days. Afebrile with no fevers on record this admission. UCX this admission pending. Will NOT dc on ABX at this point as she has cleared clinical infectious parameters.   By POD1, the day of discharge, she is ambulatory, pain controlled on PO meds, afebrile, and felt to be adequate for discharge.   Consults: None  Significant Diagnostic Studies: UCx pending  Treatments: surgery: cysto and left ureteral stent placemetn (5x25 polaris) 05/30/14  Discharge Exam: Blood pressure 101/62, pulse 56, temperature 98.1 F (36.7 C), temperature source Oral, resp. rate 14, height 5\' 8"  (1.727 m), weight 63.2 kg (139 lb 5.3 oz), last menstrual period 09/08/2010, SpO2 99 %. General appearance: alert, cooperative and appears stated age Eyes: negative Nose: Nares normal. Septum midline. Mucosa normal. No drainage or sinus tenderness. Throat: lips, mucosa, and tongue normal; teeth and gums normal Neck: supple, symmetrical, trachea midline Back: symmetric, no curvature. ROM normal. No CVA tenderness. Resp: non-labored on room air Cardio: Nl rate GI: soft, non-tender; bowel sounds normal; no masses,  no organomegaly Extremities: extremities normal, atraumatic, no cyanosis or edema Skin: Skin color, texture, turgor normal. No rashes or  lesions Lymph nodes: Cervical, supraclavicular, and axillary nodes normal. Neurologic: Grossly normal  Disposition: 01-Home or Self Care     Medication List    STOP taking these medications        polyethylene glycol powder powder  Commonly known as:  GLYCOLAX/MIRALAX      TAKE these medications        aspirin-acetaminophen-caffeine 956-213-08 MG per tablet  Commonly known as:  EXCEDRIN MIGRAINE  Take 1 tablet by mouth every 8 (eight) hours as needed for headache.     CALCIUM SOFT CHEWS PO  Take by mouth.     ibuprofen 200 MG tablet  Commonly known as:  ADVIL,MOTRIN  Take 2 tablets (400 mg total) by mouth every 6 (six) hours as needed. For pain     oxyCODONE-acetaminophen 5-325 MG per tablet  Commonly known as:  ROXICET  Take 1-2 tablets by mouth every 8 (eight) hours as needed for moderate pain or severe pain. Post-operatively     senna-docusate 8.6-50 MG per tablet  Commonly known as:  SENOKOT S  Take 1 tablet by mouth 2 (two) times daily. While taking pain meds to prevent constipation.     SUMAtriptan 50 MG tablet  Commonly known as:  IMITREX  Take one tablet at headache onset, if no improvement, may repeat in 2 hours if headache persists or recurs.           Follow-up Information    Follow up with [facility], MATTHEW, MD.   Specialty:  Urology   Why:  As scheduled next week for MD visit and plan next stage treatmetn for stones.    Contact information:   [address]  [place] [address] [phone]       Signed: Alexis Frock 05/31/2014, 7:28 AM

## 2014-06-02 ENCOUNTER — Encounter (HOSPITAL_COMMUNITY): Payer: Self-pay | Admitting: Urology

## 2014-06-04 ENCOUNTER — Other Ambulatory Visit: Payer: Self-pay | Admitting: Urology

## 2014-06-17 ENCOUNTER — Other Ambulatory Visit (HOSPITAL_COMMUNITY): Payer: Self-pay | Admitting: Anesthesiology

## 2014-06-17 NOTE — Patient Instructions (Addendum)
Margaret Carney  06/17/2014   Your procedure is scheduled on: Tuesday 06/24/14  Report to [facility] Main  Entrance and follow signs to               [place] at 05:30 AM.  Call this number if you have problems the morning of surgery [phone]   Remember: ONLY 1 PERSON MAY GO WITH YOU TO SHORT STAY TO GET  READY MORNING OF [place].  Do not eat food or drink liquids :After Midnight.     Take these medicines the morning of surgery with A SIP OF WATER: eye drops if needed                               You may not have any metal on your body including hair pins and              piercings  Do not wear jewelry, make-up, lotions, powders or perfumes, deodorant             Do not wear nail polish.  Do not shave  48 hours prior to surgery.              Men may shave face and neck.  Do not bring valuables to the hospital. [place].  Contacts, dentures or bridgework may not be worn into surgery.  Leave suitcase in the car. After surgery it may be brought to your room.     Patients discharged the day of surgery will not be allowed to drive home.  Name and phone number of your driver:  Special Instructions: N/A              Please read over the following fact sheets you were given: _____________________________________________________________________            [facility] - Preparing for Surgery Before surgery, you can play an important role.  Because skin is not sterile, your skin needs to be as free of germs as possible.  You can reduce the number of germs on your skin by washing with CHG (chlorahexidine gluconate) soap before surgery.  CHG is an antiseptic cleaner which kills germs and bonds with the skin to continue killing germs even after washing. Please DO NOT use if you have an allergy to CHG or antibacterial soaps.  If your skin becomes reddened/irritated stop using the CHG and inform your nurse  when you arrive at Short Stay. Do not shave (including legs and underarms) for at least 48 hours prior to the first CHG shower.  You may shave your face/neck. Please follow these instructions carefully:  1.  Shower with CHG Soap the night before surgery and the  morning of Surgery.  2.  If you choose to wash your hair, wash your hair first as usual with your  normal  shampoo.  3.  After you shampoo, rinse your hair and body thoroughly to remove the  shampoo.                            4.  Use CHG as you would any other liquid soap.  You can apply chg directly  to the skin and wash  Gently with a scrungie or clean washcloth.  5.  Apply the CHG Soap to your body ONLY FROM THE NECK DOWN.   Do not use on face/ open                           Wound or open sores. Avoid contact with eyes, ears mouth and genitals (private parts).                       Wash face,  Genitals (private parts) with your normal soap.             6.  Wash thoroughly, paying special attention to the area where your surgery  will be performed.  7.  Thoroughly rinse your body with warm water from the neck down.  8.  DO NOT shower/wash with your normal soap after using and rinsing off  the CHG Soap.                9.  Pat yourself dry with a clean towel.            10.  Wear clean pajamas.            11.  Place clean sheets on your bed the night of your first shower and do not  sleep with pets. Day of Surgery : Do not apply any lotions/deodorants the morning of surgery.  Please wear clean clothes to the hospital/surgery center.  FAILURE TO FOLLOW THESE INSTRUCTIONS MAY RESULT IN THE CANCELLATION OF YOUR SURGERY PATIENT SIGNATURE_________________________________  NURSE SIGNATURE__________________________________  ________________________________________________________________________   Adam Phenix  An incentive spirometer is a tool that can help keep your lungs clear and active. This tool  measures how well you are filling your lungs with each breath. Taking long deep breaths may help reverse or decrease the chance of developing breathing (pulmonary) problems (especially infection) following:  A long period of time when you are unable to move or be active. BEFORE THE PROCEDURE   If the spirometer includes an indicator to show your best effort, your nurse or respiratory therapist will set it to a desired goal.  If possible, sit up straight or lean slightly forward. Try not to slouch.  Hold the incentive spirometer in an upright position. INSTRUCTIONS FOR USE  1. Sit on the edge of your bed if possible, or sit up as far as you can in bed or on a chair. 2. Hold the incentive spirometer in an upright position. 3. Breathe out normally. 4. Place the mouthpiece in your mouth and seal your lips tightly around it. 5. Breathe in slowly and as deeply as possible, raising the piston or the ball toward the top of the column. 6. Hold your breath for 3-5 seconds or for as long as possible. Allow the piston or ball to fall to the bottom of the column. 7. Remove the mouthpiece from your mouth and breathe out normally. 8. Rest for a few seconds and repeat Steps 1 through 7 at least 10 times every 1-2 hours when you are awake. Take your time and take a few normal breaths between deep breaths. 9. The spirometer may include an indicator to show your best effort. Use the indicator as a goal to work toward during each repetition. 10. After each set of 10 deep breaths, practice coughing to be sure your lungs are clear. If you have an incision (the cut made at the time of surgery),  support your incision when coughing by placing a pillow or rolled up towels firmly against it. Once you are able to get out of bed, walk around indoors and cough well. You may stop using the incentive spirometer when instructed by your caregiver.  RISKS AND COMPLICATIONS  Take your time so you do not get dizzy or  light-headed.  If you are in pain, you may need to take or ask for pain medication before doing incentive spirometry. It is harder to take a deep breath if you are having pain. AFTER USE  Rest and breathe slowly and easily.  It can be helpful to keep track of a log of your progress. Your caregiver can provide you with a simple table to help with this. If you are using the spirometer at home, follow these instructions: [place] IF:   You are having difficultly using the spirometer.  You have trouble using the spirometer as often as instructed.  Your pain medication is not giving enough relief while using the spirometer.  You develop fever of 100.5 F (38.1 C) or higher. SEEK IMMEDIATE MEDICAL CARE IF:   You cough up bloody sputum that had not been present before.  You develop fever of 102 F (38.9 C) or greater.  You develop worsening pain at or near the incision site. MAKE SURE YOU:   Understand these instructions.  Will watch your condition.  Will get help right away if you are not doing well or get worse. Document Released: 05/30/2006 Document Revised: 04/11/2011 Document Reviewed: 07/31/2006 [facility] Patient Information 2014 [place], [place].   ________________________________________________________________________

## 2014-06-18 ENCOUNTER — Encounter (HOSPITAL_COMMUNITY): Payer: Self-pay

## 2014-06-18 ENCOUNTER — Encounter (HOSPITAL_COMMUNITY)
Admission: RE | Admit: 2014-06-18 | Discharge: 2014-06-18 | Disposition: A | Discharge status: Home / Self Care | Payer: 59 | Source: Ambulatory Visit | Attending: Urology | Admitting: Urology

## 2014-06-18 DIAGNOSIS — Z01812 Encounter for preprocedural laboratory examination: Secondary | ICD-10-CM | POA: Insufficient documentation

## 2014-06-18 DIAGNOSIS — N202 Calculus of kidney with calculus of ureter: Secondary | ICD-10-CM | POA: Diagnosis not present

## 2014-06-18 HISTORY — DX: Other complications of anesthesia, initial encounter: T88.59XA

## 2014-06-18 HISTORY — DX: Adverse effect of unspecified anesthetic, initial encounter: T41.45XA

## 2014-06-18 HISTORY — DX: Headache: R51

## 2014-06-18 HISTORY — DX: Other specified postprocedural states: Z98.890

## 2014-06-18 HISTORY — DX: Other specified postprocedural states: R11.2

## 2014-06-18 HISTORY — DX: Headache, unspecified: R51.9

## 2014-06-18 LAB — BASIC METABOLIC PANEL
ANION GAP: 8 (ref 5–15)
BUN: 23 mg/dL — ABNORMAL HIGH (ref 6–20)
CALCIUM: 9.5 mg/dL (ref 8.9–10.3)
CO2: 27 mmol/L (ref 22–32)
CREATININE: 0.86 mg/dL (ref 0.44–1.00)
Chloride: 107 mmol/L (ref 101–111)
Glucose, Bld: 87 mg/dL (ref 65–99)
Potassium: 4.2 mmol/L (ref 3.5–5.1)
SODIUM: 142 mmol/L (ref 135–145)

## 2014-06-18 LAB — CBC
HCT: 42.3 % (ref 36.0–46.0)
Hemoglobin: 13.8 g/dL (ref 12.0–15.0)
MCH: 31.2 pg (ref 26.0–34.0)
MCHC: 32.6 g/dL (ref 30.0–36.0)
MCV: 95.7 fL (ref 78.0–100.0)
Platelets: 313 10*3/uL (ref 150–400)
RBC: 4.42 MIL/uL (ref 3.87–5.11)
RDW: 13.1 % (ref 11.5–15.5)
WBC: 4.8 10*3/uL (ref 4.0–10.5)

## 2014-06-22 NOTE — H&P (Signed)
History of Present Illness             Followup --     1- Nephrolithiasis  -Mar 2013 CT scan-reviewed all the images - there is a 7 x 12 mm right lower pole stone and a 6 mm left midpole stone.   -May 2013 Underwent (R) ESWL 06/13/11  -May 2013 CT of the abdomen and pelvis for post op pain  -May 2013 started on potassium citrate for low urine pH.   -Oct 2013 KUB - no obvious stones over the region of either kidney ureter or bladder. The bones appear normal.  -Jan 2014 - Renal U/S - Right kidney had several hyperechoic areas 4 or 5 mm. No hydronephrosis or mass. The left kidney had several hyperechoic areas about 4 mm. No hydronephrosis or mass. The bladder appeared normal with a postvoid of 4.6 mL. UA clear. Urine pH 7. She is on potassium citrate.    2-SUI - Mar 2013 - mild leak with coughing and sneezing and occasional urgency.   -jan 2014 - Referred to PT.         May 2016 interval hx   Patient developed severe onset of acute flank pain Friday. Dr. Tresa Moore to her to OR for cysto, left ureteral stent after a CT scan showed a 10 mm left proximal ureteral stone. There were some small bilateral stones.    She's tolerating the stent well and has some typical lower urinary tract symptoms and flank pain with voiding but nothing serious. She is managed with some NSAIDs and pain medicine. She staying hydrated.    UA today with rare bac, MH.   Past Medical History Problems  1. History of Cushing's Syndrome 2. History of esophageal reflux (Z87.19) 3. History of Murmur (R01.1)  Surgical History Problems  1. History of Incision Of Pituitary Gland 2. History of Tonsillectomy  Current Meds 1. Advil CAPS; TAKE CAPSULE  PRN;  Therapy: (Recorded:19May2015) to Recorded 2. Urocit-K 15 15 MEQ (1620 MG) Oral Tablet Extended Release; Take 1 tablet twice daily;  Therapy: [phone] to (Evaluate:09Oct2014)  Requested for: 14Oct2013; Last  Rx:14Oct2013  Ordered  Allergies Medication  1. Codeine Derivatives  Family History Problems  1. Family history of Arthritis : Sister 2. Family history of Asthma : Sister 3. Family history of Breast Cancer : Sister 4. Family history of Diabetes Mellitus : Father 5. Family history of Family Health Status Number Of Children   1 son 44. Family history of Hypertension : Mother 63. Family history of Nephrolithiasis : Brother  Social History Problems  1. Denied: History of Alcohol Use 2. Caffeine Use   3-4 per day 3. Marital History - Currently Married 4. Never A Smoker 5. Occupation:   Government social research officer 6. Denied: History of Tobacco Use  Vitals Vital Signs [Data Includes: Last 1 Day]  Recorded: [id number] 03:58PM  Height: 5 ft 8 in Weight: 138 lb  BMI Calculated: 20.98 BSA Calculated: 1.75 Blood Pressure: 133 / 84 Temperature: 98.3 F Heart Rate: 65  Physical Exam Constitutional: Well nourished and well developed . No acute distress.  Neuro/Psych:. Mood and affect are appropriate.    Results/Data Urine [Data Includes: Last 1 Day]   [id number]  COLOR RED   APPEARANCE CLOUDY   SPECIFIC GRAVITY 1.020   pH 7.0   GLUCOSE NEG mg/dL  BILIRUBIN SMALL   KETONE NEG mg/dL  BLOOD LARGE   PROTEIN 100 mg/dL  UROBILINOGEN 0.2 mg/dL  NITRITE NEG   LEUKOCYTE ESTERASE LARGE  SQUAMOUS EPITHELIAL/HPF NONE SEEN   WBC 0-2 WBC/hpf  RBC TNTC RBC/hpf  BACTERIA RARE   CRYSTALS NONE SEEN   CASTS NONE SEEN    Old records or history reviewed:Marland Kitchen  The following images/tracing/specimen were independently visualized: Marland Kitchen    Assessment Assessed  1. Left ureteral stone (N20.1)  Plan Health Maintenance  1. UA With REFLEX; [Do Not Release]; Status:Complete;   Done: [id number] 03:41PM Left ureteral stone  2. URINE CULTURE; Status:Hold For - Specimen/Data Collection,Appointment; Requested  for:03May2016;  3. Follow-up Schedule Surgery Office  Follow-up  Status: Complete  Done:  [id number]  Discussion/Summary Left proximal ureteral stone status post stenting, bilateral kidney stones-we discussed the nature risk and benefits of shockwave lithotripsy or ureteroscopy for definitive stone management. Patient did not do well with shockwave lithotripsy in the past and she hopes to avoid that. Certainly one advantage of the stent and we can check the left kidney for any stones and ureteroscopy should be more straightforward. She would like to proceed with ureteroscopy and she would like to access the right side as well to try to clear those 2 small stones. If we are having trouble getting right retrograde access we will not pursue those. To keep her out of the OR. She does not want a staged procedure. Her husband is going to cancer treatment every other week and the patient needs to limit procedures. I did send urine for culture as a precaution.      Signatures Electronically signed by : Festus Aloe, M.D.; Jun 03 2014  4:46PM EST   Add: Urine Cx negative.

## 2014-06-24 ENCOUNTER — Encounter (HOSPITAL_COMMUNITY): Payer: Self-pay | Admitting: *Deleted

## 2014-06-24 ENCOUNTER — Ambulatory Visit (HOSPITAL_BASED_OUTPATIENT_CLINIC_OR_DEPARTMENT_OTHER)
Admission: RE | Admit: 2014-06-24 | Discharge: 2014-06-24 | Disposition: A | Discharge status: Home / Self Care | Payer: 59 | Source: Ambulatory Visit | Attending: Urology | Admitting: Urology

## 2014-06-24 ENCOUNTER — Ambulatory Visit (HOSPITAL_COMMUNITY): Payer: 59 | Admitting: Anesthesiology

## 2014-06-24 ENCOUNTER — Encounter (HOSPITAL_COMMUNITY)
Admission: RE | Disposition: A | Discharge status: Home / Self Care | Payer: Self-pay | Source: Ambulatory Visit | Attending: Urology

## 2014-06-24 DIAGNOSIS — K5730 Diverticulosis of large intestine without perforation or abscess without bleeding: Secondary | ICD-10-CM | POA: Insufficient documentation

## 2014-06-24 DIAGNOSIS — I341 Nonrheumatic mitral (valve) prolapse: Secondary | ICD-10-CM | POA: Insufficient documentation

## 2014-06-24 DIAGNOSIS — Z86010 Personal history of colonic polyps: Secondary | ICD-10-CM | POA: Insufficient documentation

## 2014-06-24 DIAGNOSIS — Z886 Allergy status to analgesic agent status: Secondary | ICD-10-CM | POA: Diagnosis not present

## 2014-06-24 DIAGNOSIS — K219 Gastro-esophageal reflux disease without esophagitis: Secondary | ICD-10-CM | POA: Diagnosis not present

## 2014-06-24 DIAGNOSIS — N201 Calculus of ureter: Secondary | ICD-10-CM | POA: Insufficient documentation

## 2014-06-24 DIAGNOSIS — Z7982 Long term (current) use of aspirin: Secondary | ICD-10-CM | POA: Insufficient documentation

## 2014-06-24 DIAGNOSIS — G43909 Migraine, unspecified, not intractable, without status migrainosus: Secondary | ICD-10-CM | POA: Insufficient documentation

## 2014-06-24 HISTORY — PX: CYSTOSCOPY/URETEROSCOPY/HOLMIUM LASER/STENT PLACEMENT: SHX6546

## 2014-06-24 HISTORY — PX: HOLMIUM LASER APPLICATION: SHX5852

## 2014-06-24 SURGERY — CYSTOSCOPY/URETEROSCOPY/HOLMIUM LASER/STENT PLACEMENT
Anesthesia: General | Laterality: Left

## 2014-06-24 MED ORDER — CEFAZOLIN SODIUM-DEXTROSE 2-3 GM-% IV SOLR
2.0000 g | INTRAVENOUS | Status: AC
  Administered 2014-06-24: 2 g via INTRAVENOUS

## 2014-06-24 MED ORDER — PROMETHAZINE HCL 12.5 MG PO TABS: 12.5000 mg | ORAL_TABLET | Freq: Four times a day (QID) | ORAL | Status: DC | PRN

## 2014-06-24 MED ORDER — PROMETHAZINE HCL 25 MG/ML IJ SOLN
INTRAMUSCULAR | Status: AC
  Filled 2014-06-24: qty 1

## 2014-06-24 MED ORDER — PROPOFOL 10 MG/ML IV BOLUS
INTRAVENOUS | Status: DC | PRN
  Administered 2014-06-24: 120 mg via INTRAVENOUS

## 2014-06-24 MED ORDER — ONDANSETRON HCL 4 MG/2ML IJ SOLN
INTRAMUSCULAR | Status: DC | PRN
  Administered 2014-06-24: 4 mg via INTRAVENOUS

## 2014-06-24 MED ORDER — DEXAMETHASONE SODIUM PHOSPHATE 10 MG/ML IJ SOLN
INTRAMUSCULAR | Status: DC | PRN
  Administered 2014-06-24: 10 mg via INTRAVENOUS

## 2014-06-24 MED ORDER — OXYCODONE-ACETAMINOPHEN 5-325 MG PO TABS: 1.0000 | ORAL_TABLET | Freq: Four times a day (QID) | ORAL | Status: DC | PRN

## 2014-06-24 MED ORDER — ACETAMINOPHEN 10 MG/ML IV SOLN
1000.0000 mg | Freq: Once | INTRAVENOUS | Status: AC
  Administered 2014-06-24: 1000 mg via INTRAVENOUS
  Filled 2014-06-24: qty 100

## 2014-06-24 MED ORDER — FENTANYL CITRATE (PF) 250 MCG/5ML IJ SOLN
INTRAMUSCULAR | Status: DC | PRN
  Administered 2014-06-24 (×4): 25 ug via INTRAVENOUS

## 2014-06-24 MED ORDER — KETOROLAC TROMETHAMINE 30 MG/ML IJ SOLN
INTRAMUSCULAR | Status: AC
  Filled 2014-06-24: qty 1

## 2014-06-24 MED ORDER — FENTANYL CITRATE (PF) 100 MCG/2ML IJ SOLN
INTRAMUSCULAR | Status: AC
  Filled 2014-06-24: qty 2

## 2014-06-24 MED ORDER — MIDAZOLAM HCL 5 MG/5ML IJ SOLN
INTRAMUSCULAR | Status: DC | PRN
  Administered 2014-06-24: 2 mg via INTRAVENOUS

## 2014-06-24 MED ORDER — OXYCODONE-ACETAMINOPHEN 5-325 MG PO TABS
1.0000 | ORAL_TABLET | ORAL | Status: DC | PRN
  Administered 2014-06-24: 1 via ORAL
  Filled 2014-06-24: qty 1

## 2014-06-24 MED ORDER — MIDAZOLAM HCL 2 MG/2ML IJ SOLN
INTRAMUSCULAR | Status: AC
  Filled 2014-06-24: qty 2

## 2014-06-24 MED ORDER — DEXAMETHASONE SODIUM PHOSPHATE 10 MG/ML IJ SOLN
INTRAMUSCULAR | Status: AC
  Filled 2014-06-24: qty 1

## 2014-06-24 MED ORDER — CEFAZOLIN SODIUM-DEXTROSE 2-3 GM-% IV SOLR
INTRAVENOUS | Status: AC
  Filled 2014-06-24: qty 50

## 2014-06-24 MED ORDER — PROMETHAZINE HCL 25 MG/ML IJ SOLN
6.2500 mg | Freq: Four times a day (QID) | INTRAMUSCULAR | Status: DC | PRN
  Administered 2014-06-24: 6.25 mg via INTRAVENOUS

## 2014-06-24 MED ORDER — ONDANSETRON HCL 4 MG/2ML IJ SOLN
INTRAMUSCULAR | Status: AC
  Filled 2014-06-24: qty 2

## 2014-06-24 MED ORDER — LACTATED RINGERS IV SOLN
INTRAVENOUS | Status: DC | PRN
  Administered 2014-06-24 (×2): via INTRAVENOUS

## 2014-06-24 MED ORDER — PROPOFOL 10 MG/ML IV BOLUS
INTRAVENOUS | Status: AC
  Filled 2014-06-24: qty 20

## 2014-06-24 MED ORDER — KETOROLAC TROMETHAMINE 30 MG/ML IJ SOLN
INTRAMUSCULAR | Status: DC | PRN
  Administered 2014-06-24: 30 mg via INTRAVENOUS

## 2014-06-24 MED ORDER — IOHEXOL 300 MG/ML  SOLN
INTRAMUSCULAR | Status: DC | PRN
  Administered 2014-06-24: 3 mL

## 2014-06-24 MED ORDER — SODIUM CHLORIDE 0.9 % IR SOLN
Status: DC | PRN
  Administered 2014-06-24: 4000 mL

## 2014-06-24 MED ORDER — PROMETHAZINE HCL 12.5 MG PO TABS
12.5000 mg | ORAL_TABLET | Freq: Once | ORAL | Status: AC
Start: 2014-06-24 — End: 2014-06-24
  Administered 2014-06-24: 12.5 mg via ORAL
  Filled 2014-06-24: qty 1

## 2014-06-24 MED ORDER — FENTANYL CITRATE (PF) 100 MCG/2ML IJ SOLN
25.0000 ug | INTRAMUSCULAR | Status: DC | PRN
  Administered 2014-06-24 (×2): 25 ug via INTRAVENOUS
  Administered 2014-06-24: 50 ug via INTRAVENOUS
  Administered 2014-06-24: 25 ug via INTRAVENOUS

## 2014-06-24 MED ORDER — SULFAMETHOXAZOLE-TRIMETHOPRIM 800-160 MG PO TABS: 1.0000 | ORAL_TABLET | Freq: Every day | ORAL | Status: DC

## 2014-06-24 MED ORDER — SCOPOLAMINE 1 MG/3DAYS TD PT72
MEDICATED_PATCH | TRANSDERMAL | Status: DC | PRN
  Administered 2014-06-24: 1 via TRANSDERMAL

## 2014-06-24 MED ORDER — SCOPOLAMINE 1 MG/3DAYS TD PT72
MEDICATED_PATCH | TRANSDERMAL | Status: AC
  Filled 2014-06-24: qty 1

## 2014-06-24 MED ORDER — LACTATED RINGERS IV SOLN: INTRAVENOUS | Status: DC

## 2014-06-24 MED ORDER — FENTANYL CITRATE (PF) 250 MCG/5ML IJ SOLN
INTRAMUSCULAR | Status: AC
  Filled 2014-06-24: qty 5

## 2014-06-24 SURGICAL SUPPLY — 24 items
BAG URO CATCHER STRL LF (DRAPE) ×2 IMPLANT
BASKET ZERO TIP NITINOL 2.4FR (BASKET) ×2 IMPLANT
BSKT STON RTRVL ZERO TP 2.4FR (BASKET) ×1
CATH URET 5FR 28IN CONE TIP (BALLOONS)
CATH URET 5FR 28IN OPEN ENDED (CATHETERS) ×1 IMPLANT
CATH URET 5FR 70CM CONE TIP (BALLOONS) ×1 IMPLANT
CLOTH BEACON ORANGE TIMEOUT ST (SAFETY) ×2 IMPLANT
FIBER LASER FLEXIVA 1000 (UROLOGICAL SUPPLIES) IMPLANT
FIBER LASER FLEXIVA 200 (UROLOGICAL SUPPLIES) ×1 IMPLANT
FIBER LASER FLEXIVA 365 (UROLOGICAL SUPPLIES) IMPLANT
FIBER LASER FLEXIVA 550 (UROLOGICAL SUPPLIES) IMPLANT
FIBER LASER TRAC TIP (UROLOGICAL SUPPLIES) ×1 IMPLANT
GLOVE BIO SURGEON STRL SZ7.5 (GLOVE) ×2 IMPLANT
GOWN STRL REUS W/TWL XL LVL3 (GOWN DISPOSABLE) ×2 IMPLANT
GUIDEWIRE ANG ZIPWIRE 038X150 (WIRE) ×1 IMPLANT
GUIDEWIRE STR DUAL SENSOR (WIRE) ×2 IMPLANT
MANIFOLD NEPTUNE II (INSTRUMENTS) ×2 IMPLANT
PACK CYSTO (CUSTOM PROCEDURE TRAY) ×2 IMPLANT
SHEATH ACCESS URETERAL 24CM (SHEATH) ×1 IMPLANT
SHEATH ACCESS URETERAL 38CM (SHEATH) ×2 IMPLANT
STENT CONTOUR 6FRX26X.038 (STENTS) ×1 IMPLANT
STENT URET 6FRX24 CONTOUR (STENTS) ×1 IMPLANT
TUBING CONNECTING 10 (TUBING) ×2 IMPLANT
WIRE COONS/BENSON .038X145CM (WIRE) ×1 IMPLANT

## 2014-06-24 NOTE — Anesthesia Postprocedure Evaluation (Signed)
  Anesthesia Post-op Note  Patient: Margaret Carney  Procedure(s) Performed: Procedure(s) (LRB): CYSTOSCOPY, BILATERAL URETEROSCOPY, BILATERAL STENT PLACEMENT (Left) HOLMIUM LASER LITHOTRIPSY  (Left)  Patient Location: PACU  Anesthesia Type: General  Level of Consciousness: awake and alert   Airway and Oxygen Therapy: Patient Spontanous Breathing  Post-op Pain: mild  Post-op Assessment: Post-op Vital signs reviewed, Patient's Cardiovascular Status Stable, Respiratory Function Stable, Patent Airway and No signs of Nausea or vomiting  Last Vitals:  Filed Vitals:   06/24/14 1145  BP: 115/68  Pulse: 66  Temp:   Resp: 16    Post-op Vital Signs: stable   Complications: No apparent anesthesia complications

## 2014-06-24 NOTE — Discharge Instructions (Signed)
Ureteral Stent Implantation, Care After Refer to this sheet in the next few weeks. These instructions provide you with information on caring for yourself after your procedure. Your health care provider may also give you more specific instructions. Your treatment has been planned according to current medical practices, but problems sometimes occur. Call your health care provider if you have any problems or questions after your procedure. WHAT TO EXPECT AFTER THE PROCEDURE You should be back to normal activity within 48 hours after the procedure. Nausea and vomiting may occur and are commonly the result of anesthesia. It is common to experience sharp pain in the back or lower abdomen and penis with voiding. This is caused by movement of the ends of the stent with the act of urinating.It usually goes away within minutes after you have stopped urinating. HOME CARE INSTRUCTIONS Make sure to drink plenty of fluids. You may have small amounts of bleeding, causing your urine to be red. This is normal. Certain movements may trigger pain or a feeling that you need to urinate. You may be given medicines to prevent infection or bladder spasms. Be sure to take all medicines as directed. Only take over-the-counter or prescription medicines for pain, discomfort, or fever as directed by your health care provider. Do not take aspirin, as this can make bleeding worse.  REMOVAL OF THE STENTS: remove one stent/string on Friday morning, Jun 27, 2014. Remove the other stent/string on Monday morning, Jun 30, 2014. Pull the string slowly until the entire stent is removed.    Be sure to keep all follow-up appointments so your health care provider can check that you are healing properly (in 6 weeks for a renal ultrasound).   SEEK MEDICAL CARE IF:  You experience increasing pain.  Your pain medicine is not working. SEEK IMMEDIATE MEDICAL CARE IF:  Your urine is dark red or has blood clots.  You are leaking urine  (incontinent).  You have a fever, chills, feeling sick to your stomach (nausea), or vomiting.  Your pain is not relieved by pain medicine.  The end of the stent comes out of the urethra.                                                                                                                                                                               PATIENT INSTRUCTIONS POST-ANESTHESIA  IMMEDIATELY FOLLOWING SURGERY:  Do not drive or operate machinery for the first twenty four hours after surgery.  Do not make any important decisions for twenty four hours after surgery or while taking narcotic pain medications or sedatives.  If you develop intractable nausea and vomiting or a severe headache please notify your doctor immediately.  FOLLOW-UP:  Please make an appointment with your surgeon as instructed. You do not need to follow up with anesthesia unless specifically instructed to do so.  WOUND CARE INSTRUCTIONS (if applicable):  Keep a dry clean dressing on the anesthesia/puncture wound site if there is drainage.  Once the wound has quit draining you may leave it open to air.  Generally you should leave the bandage intact for twenty four hours unless there is drainage.  If the epidural site drains for more than 36-48 hours please call the anesthesia department.  QUESTIONS?:  Please feel free to call your physician or the hospital operator if you have any questions, and they will be happy to assist you.

## 2014-06-24 NOTE — Op Note (Signed)
Preoperative diagnosis: right renal stones, left ureteral stone  Postoperative diagnosis: left ureteral stone  Procedure: Right retrograde pyelogram, right ureteroscopy, right ureteral stent placement;  left ureteroscopy, holmium laser lithotripsy, Stone basket extraction, left ureteral stent placement.   Surgeon: Junious Silk   Type of anesthesia: Gen.   Indication for procedure: Patient's a 4 a female with a large proximal left ureteral stone status post prior left ureteral stent. CT scan she also had 2 possible stones in the right collecting system and she desired a possible to be stone free with bilateral procedure today. She was brought for bilateral ureteroscopy.   Findings:  On scout imaging I did not appreciate any stones over the right kidney.   Right retrograde pyelogram-this outlined a single ureter single collecting system unit thousand and defect stricture dilation. There was brisk drainage of contrast.   Right ureteroscopy-no stones were noted in the collecting system or ureter.   Left ureteroscopy-large stone noted in the proximal ureter which was fragmented and removed in its entirety. Kidney was clear.   Both ureters free of fragments or injury on exit.   Description of procedure: After consent was obtained patient brought to the operating room. After adequate anesthesia she is placed in lithotomy position and prepped and draped in the usual sterile fashion. A timeout was performed to confirm the patient and procedure. The cystoscope was passed per urethra. The urethra appeared normal. The bladder mucosa appeared normal. There were no stones in the bladder. There was a left ureteral stent emanating from the left ureteral orifice.  The right ureteral orifice cannulated with a 5 [place] open-ended catheter and retrograde injection of contrast was performed. A sensor wire was then advanced and coiled in the collecting system. A medium access sheath was used as a coaxial dilator  to place 2 wires. They access sheath went easily. I then passed the access sheath back over the sensor wire leaving the Glidewire is a safety wire. Ureteroscopy was performed and no stones were noted in the collecting system of the right kidney. I suspect the stones on CT are submucosal.   The collecting system was inspected one more time the renal pelvis and proximal ureter were noted to be normal on exit. The access sheath in the ureteroscope were withdrawn together and the ureter was noted to be without stone or injury. There was some mild bleeding and edema customary with the access sheath therefore I decided to place a stent. The wire was backloaded on the cystoscope and a 626 cm stent was advanced. The wire was removed with a good coil reconstituting in the kidney and a good coil in the bladder. I left a string on the stent.   The left ureteral stent was grasped and removed through the meatus. A sensor wire was advanced through the stent and coiled in the collecting system. Again the access sheath was used as a coaxial dilator to place a second wire. I then passed the access sheath over the Glidewire leaving the sensor wires a safety wire. Ureteroscopy was performed and the stone was noted in the proximal ureter with some edema and clot around it. Laser fiber was deployed in the stone was fragmented at 0.3 and 40 into a few small pieces which were extracted with a Nitinol basket. The larger stone washed into the renal pelvis. It was grasped with the basket and placed in the upper pole. A few fragments it washed into the midpole in the lower pole and these were sequentially cleared  with the Nitinol basket. The stone in the upper pole was then approached and fragmented is setting of 0.8 and 10 and the several larger pieces. These were sequentially removed. On inspection of the collecting system there were no other significant stone fragments there were a few 1 mm or less fragments in the midpole which could  not be basketed. Final inspection of the collecting system noted it did be clear of any significant fragments the renal pelvis and proximal ureter appeared normal. The access sheath and the ureteroscope backed out together and the ureter appeared normal without fragment or injury. The wire was backloaded on the cystoscope and a 626 cm stent was placed. Again a string was left on the stent. The bladder was drained and the scope removed. The strings were secured to the patient after she was cleaned off. The patient was awakened taken to recovery room in stable condition.   Complications: None  Blood loss: Minimal   Specimens: Stone fragments to office lab   Drains: Bilateral 6 x 26 cm ureteral stents with tethers

## 2014-06-24 NOTE — Interval H&P Note (Signed)
History and Physical Interval Note:  06/24/2014 7:26 AM  Margaret Carney  has presented today for surgery, with the diagnosis of RIGHT AND LEFT URETERAL STONES, BILATERAL KIDNEY STONES   The various methods of treatment have been discussed with the patient and family. After consideration of risks, benefits and other options for treatment, the patient has consented to  Procedure(s): CYSTOSCOPY LEFT URETEROSCOPY, STENT PLACEMENT (Left) HOLMIUM LASER LITHOTRIPSY  (N/A) and RIGHT as a surgical intervention .  The patient's history has been reviewed, patient examined, no change in status, stable for surgery.  I have reviewed the patient's chart and labs.  Questions were answered to the patient's satisfaction.  Pt has been well. No fever or dysuria. Mild urgency from stent.    Kathey Simer

## 2014-06-24 NOTE — Transfer of Care (Signed)
Immediate Anesthesia Transfer of Care Note  Patient: Margaret Carney  Procedure(s) Performed: Procedure(s): CYSTOSCOPY, BILATERAL URETEROSCOPY, BILATERAL STENT PLACEMENT (Left) HOLMIUM LASER LITHOTRIPSY  (Left)  Patient Location: PACU  Anesthesia Type:General  Level of Consciousness: awake, alert , oriented and patient cooperative  Airway & Oxygen Therapy: Patient Spontanous Breathing and Patient connected to face mask oxygen  Post-op Assessment: Report given to RN and Post -op Vital signs reviewed and stable  Post vital signs: Reviewed and stable  Last Vitals:  Filed Vitals:   06/24/14 0537  BP: 123/79  Pulse: 78  Temp: 36.6 C  Resp: 18    Complications: No apparent anesthesia complications

## 2014-06-24 NOTE — Anesthesia Preprocedure Evaluation (Addendum)
Anesthesia Evaluation  Patient identified by MRN, date of birth, ID band Patient awake    Reviewed: Allergy & Precautions, NPO status , Patient's Chart, lab work & pertinent test results  History of Anesthesia Complications (+) PONV and history of anesthetic complications  Airway Mallampati: I  TM Distance: >3 FB Neck ROM: Full    Dental no notable dental hx. (+) Teeth Intact, Caps, Dental Advisory Given,  Bonding right upper front:   Pulmonary neg pulmonary ROS,  breath sounds clear to auscultation  Pulmonary exam normal       Cardiovascular negative cardio ROS Normal cardiovascular exam+ Valvular Problems/Murmurs MVP Rhythm:Regular Rate:Normal     Neuro/Psych  Headaches, negative psych ROS   GI/Hepatic Neg liver ROS, GERD-  Medicated and Controlled,  Endo/Other  negative endocrine ROSCushing's disease - resolved  Renal/GU Hx/o ureteral calculi  negative genitourinary   Musculoskeletal negative musculoskeletal ROS (+)   Abdominal   Peds  Hematology negative hematology ROS (+)   Anesthesia Other Findings   Reproductive/Obstetrics                            Anesthesia Physical Anesthesia Plan  ASA: III  Anesthesia Plan: General   Post-op Pain Management:    Induction: Intravenous  Airway Management Planned: LMA  Additional Equipment:   Intra-op Plan:   Post-operative Plan:   Informed Consent:   Plan Discussed with: Surgeon  Anesthesia Plan Comments:         Anesthesia Quick Evaluation

## 2014-06-24 NOTE — Anesthesia Procedure Notes (Signed)
Procedure Name: LMA Insertion Date/Time: 06/24/2014 7:38 AM Performed by: Dione Booze Pre-anesthesia Checklist: Patient identified, Emergency Drugs available, Suction available and Patient being monitored Patient Re-evaluated:Patient Re-evaluated prior to inductionOxygen Delivery Method: Circle system utilized Preoxygenation: Pre-oxygenation with 100% oxygen Intubation Type: IV induction LMA: LMA inserted LMA Size: 4.0 Number of attempts: 1 Placement Confirmation: positive ETCO2 Tube secured with: Tape Dental Injury: Teeth and Oropharynx as per pre-operative assessment

## 2014-06-24 NOTE — OR Nursing (Signed)
Left ureteral stones sent with Dr. Junious Silk.

## 2014-06-24 NOTE — Interval H&P Note (Signed)
History and Physical Interval Note:  06/24/2014 7:40 AM  I should add I discussed options for the right side again. Pt would like for me to take a look up the right side and clear any stones if possible, but does not desire a pre-stent/staged procedure. I told her she may not be "stone free" if I cant clear the right today and again she expressed desire to avoid another anesthetic. This is similar to our discussion in the office.    Margaret Carney

## 2014-06-25 ENCOUNTER — Encounter (HOSPITAL_COMMUNITY): Payer: Self-pay | Admitting: Urology

## 2014-11-17 ENCOUNTER — Emergency Department (HOSPITAL_BASED_OUTPATIENT_CLINIC_OR_DEPARTMENT_OTHER): Payer: 59

## 2014-11-17 ENCOUNTER — Encounter (HOSPITAL_BASED_OUTPATIENT_CLINIC_OR_DEPARTMENT_OTHER): Payer: Self-pay | Admitting: Emergency Medicine

## 2014-11-17 ENCOUNTER — Emergency Department (HOSPITAL_BASED_OUTPATIENT_CLINIC_OR_DEPARTMENT_OTHER)
Admission: EM | Admit: 2014-11-17 | Discharge: 2014-11-18 | Disposition: A | Discharge status: Home / Self Care | Payer: 59 | Attending: Emergency Medicine | Admitting: Emergency Medicine

## 2014-11-17 DIAGNOSIS — Z87442 Personal history of urinary calculi: Secondary | ICD-10-CM | POA: Diagnosis not present

## 2014-11-17 DIAGNOSIS — R200 Anesthesia of skin: Secondary | ICD-10-CM | POA: Diagnosis present

## 2014-11-17 DIAGNOSIS — R55 Syncope and collapse: Secondary | ICD-10-CM | POA: Diagnosis not present

## 2014-11-17 DIAGNOSIS — Z79899 Other long term (current) drug therapy: Secondary | ICD-10-CM | POA: Diagnosis not present

## 2014-11-17 DIAGNOSIS — R011 Cardiac murmur, unspecified: Secondary | ICD-10-CM | POA: Diagnosis not present

## 2014-11-17 DIAGNOSIS — Z86010 Personal history of colonic polyps: Secondary | ICD-10-CM | POA: Diagnosis not present

## 2014-11-17 DIAGNOSIS — Z8719 Personal history of other diseases of the digestive system: Secondary | ICD-10-CM | POA: Insufficient documentation

## 2014-11-17 DIAGNOSIS — Z8639 Personal history of other endocrine, nutritional and metabolic disease: Secondary | ICD-10-CM | POA: Insufficient documentation

## 2014-11-17 NOTE — ED Provider Notes (Signed)
CSN: [id number]     Arrival date & time 11/17/14  2302 History  By signing my name below, I, Meriel Pica, attest that this documentation has been prepared under the direction and in the presence of Shanon Rosser, MD. Electronically Signed: Meriel Pica, ED Scribe. 11/17/2014. 11:51 PM.   Chief Complaint  Patient presents with  . Numbness   The history is provided by the patient. No language interpreter was used.   HPI Comments: Margaret Carney is a 54 y.o. female, with a significant PMhx, who presents to the Emergency Department complaining of a sudden onset pre-syncopal episode that lasted for 5-10 minutes and occurred PTA. Pt reports she was standing up conversing with her husband when she became light-headed with blurred vision in bilateral eyes and numbness in her arms, hands, lips and tongue. Her symptoms have resolved with the exception of mild right hand numbness and an episode where her right hand was shaking while holding the phone. Additionally she states her head feels 'strange' but she is unable to describe this feeling any further. Pt endorses being stressed recently due to her husband having cancer in her mother on hospice care. Denies LOC, CP, SOB, or hyperventilation at the time of the incident.    Past Medical History  Diagnosis Date  . MVP (mitral valve prolapse)   . Kidney stone on right side   . Allergy     seasonal  . Osteopenia   . Heart murmur   . GERD (gastroesophageal reflux disease)   . Diverticulosis 2013  . Internal and external hemorrhoids without complication 4765  . Hx of colonic polyps 2013    Tubular Adenoma 2 mm rectal  . Cushing's disease ([place])   . Complication of anesthesia   . PONV (postoperative nausea and vomiting)     needs Scoploamine patch and meds. to prevent nausea- had surgery 05/30/2014 and was given these  . Headache     migraines   Past Surgical History  Procedure Laterality Date  . Colonoscopy w/ biopsies  2013  . Pitituary  tumor  1989  . Laparoscopic endometriosis fulguration  1989  . Tonsillectomy and adenoidectomy  1968  . Lithotripsy      Nephrolithiasis  . Cystoscopy Left 05/30/2014    Procedure: CYSTOSCOPY/LEFT RETROGRADE PYELOGRAM/PLACEMENT URETERAL STENT;  Surgeon: Alexis Frock, MD;  Location: [facility];  Service: Urology;  Laterality: Left;  . Breast surgery      right breast biopsy-benign  . Cystoscopy/ureteroscopy/holmium laser/stent placement Left 06/24/2014    Procedure: CYSTOSCOPY, BILATERAL URETEROSCOPY, BILATERAL STENT PLACEMENT;  Surgeon: Festus Aloe, MD;  Location: [facility];  Service: Urology;  Laterality: Left;  . Holmium laser application Left 4/65/0354    Procedure: HOLMIUM LASER LITHOTRIPSY ;  Surgeon: Festus Aloe, MD;  Location: [facility];  Service: Urology;  Laterality: Left;   Family History  Problem Relation Age of Onset  . Colon polyps Mother   . Colon cancer Neg Hx   . Stomach cancer Neg Hx   . Ulcerative colitis Neg Hx   . Esophageal cancer Neg Hx   . Breast cancer Sister   . Diabetes Father     Living, 67  . Hypertension Mother     Living, 28  . Dementia Mother    Social History  Substance Use Topics  . Smoking status: Never Smoker   . Smokeless tobacco: Never Used  . Alcohol Use: No   OB History    No data available     Review of  Systems  A complete 10 system review of systems was obtained and is otherwise negative except at noted in the HPI and PMH.  Allergies  Codeine  Home Medications   Prior to Admission medications   Medication Sig Start Date End Date Taking? Authorizing Provider  aspirin-acetaminophen-caffeine (EXCEDRIN MIGRAINE) [phone] MG per tablet Take 1 tablet by mouth every 8 (eight) hours as needed for headache.    Historical Provider, MD  Calcium-Vitamin D-Vitamin K (CALCIUM SOFT CHEWS PO) Take 1 each by mouth at bedtime.     Historical Provider, MD  ibuprofen (ADVIL,MOTRIN) 200 MG tablet Take 2 tablets (400 mg total) by mouth every 6  (six) hours as needed. For pain Patient taking differently: Take 400 mg by mouth every 6 (six) hours as needed for headache or moderate pain. For pain  06/16/11   Festus Aloe, MD  Multiple Vitamin (MULTIVITAMIN WITH MINERALS) TABS tablet Take 1 tablet by mouth at bedtime.    Historical Provider, MD  oxyCODONE-acetaminophen (ROXICET) 5-325 MG per tablet Take 1-2 tablets by mouth every 8 (eight) hours as needed for moderate pain or severe pain. Post-operatively Patient not taking: Reported on 06/12/2014 05/31/14   Alexis Frock, MD  oxyCODONE-acetaminophen (ROXICET) 5-325 MG per tablet Take 1-2 tablets by mouth every 6 (six) hours as needed for severe pain. 06/24/14   Festus Aloe, MD  Polyethyl Glycol-Propyl Glycol (SYSTANE OP) Apply 1-2 drops to eye daily as needed (dry eyes.).    Historical Provider, MD  promethazine (PHENERGAN) 12.5 MG tablet Take 1 tablet (12.5 mg total) by mouth every 6 (six) hours as needed for nausea or vomiting. 06/24/14   Festus Aloe, MD  senna-docusate (SENOKOT S) 8.6-50 MG per tablet Take 1 tablet by mouth 2 (two) times daily. While taking pain meds to prevent constipation. Patient taking differently: Take 1 tablet by mouth 2 (two) times daily as needed for mild constipation.  05/31/14   Alexis Frock, MD  sulfamethoxazole-trimethoprim (BACTRIM DS,SEPTRA DS) 800-160 MG per tablet Take 1 tablet by mouth at bedtime. 06/24/14   Festus Aloe, MD  SUMAtriptan (IMITREX) 50 MG tablet Take one tablet at headache onset, if no improvement, may repeat in 2 hours if headache persists or recurs. 04/03/13   Donika K Patel, DO   BP 165/80 mmHg  Pulse 90  Temp(Src) 97.6 F (36.4 C) (Oral)  Resp 18  Ht 5\' 8"  (1.727 m)  Wt 135 lb (61.236 kg)  BMI 20.53 kg/m2  SpO2 100%  LMP 09/08/2010  Physical Exam General: Well-developed, well-nourished female in no acute distress; appearance consistent with age of record HENT: normocephalic; atraumatic Eyes: pupils equal, round and  reactive to light; extraocular muscles intact Neck: supple Heart: regular rate and rhythm; no murmurs, rubs or gallops Lungs: clear to auscultation bilaterally Abdomen: soft; nondistended; nontender; no masses or hepatosplenomegaly; bowel sounds present Extremities: No deformity; full range of motion; pulses normal Neurologic: Awake, alert and oriented; motor function intact in all extremities and symmetric; sensation intact and symmetric except subjectively altered sensation in right hand; normal coordination, speech and gait; negative Romberg; normal finger to nose; no facial droop Skin: Warm and dry Psychiatric: Flat affect   ED Course  Procedures  DIAGNOSTIC STUDIES: Oxygen Saturation is 100% on RA, normal by my interpretation.    COORDINATION OF CARE: 11:51 PM Discussed treatment plan with pt at bedside and pt agreed to plan.   MDM   Nursing notes and vitals signs, including pulse oximetry, reviewed.  Summary of this visit's results, reviewed  by myself:   EKG Interpretation  Date/Time:  Tuesday November 18 2014 00:05:02 EDT Ventricular Rate:  71 PR Interval:  152 QRS Duration: 90 QT Interval:  416 QTC Calculation: 452 R Axis:   72 Text Interpretation:  Normal sinus rhythm Normal ECG No previous ECGs available Confirmed by Crystal Scarberry  MD, Jenny Reichmann ([id number]) on 11/18/2014 12:14:10 AM       Labs:  Results for orders placed or performed during the hospital encounter of 11/17/14 (from the past 24 hour(s))  CBC with Differential/Platelet     Status: None   Collection Time: 11/18/14 12:01 AM  Result Value Ref Range   WBC 7.1 4.0 - 10.5 K/uL   RBC 4.37 3.87 - 5.11 MIL/uL   Hemoglobin 13.6 12.0 - 15.0 g/dL   HCT 40.5 36.0 - 46.0 %   MCV 92.7 78.0 - 100.0 fL   MCH 31.1 26.0 - 34.0 pg   MCHC 33.6 30.0 - 36.0 g/dL   RDW 12.9 11.5 - 15.5 %   Platelets 238 150 - 400 K/uL   Neutrophils Relative % 58 %   Neutro Abs 4.0 1.7 - 7.7 K/uL   Lymphocytes Relative 28 %   Lymphs Abs 2.0 0.7  - 4.0 K/uL   Monocytes Relative 11 %   Monocytes Absolute 0.8 0.1 - 1.0 K/uL   Eosinophils Relative 3 %   Eosinophils Absolute 0.2 0.0 - 0.7 K/uL   Basophils Relative 0 %   Basophils Absolute 0.0 0.0 - 0.1 K/uL  Basic metabolic panel     Status: Abnormal   Collection Time: 11/18/14 12:01 AM  Result Value Ref Range   Sodium 137 135 - 145 mmol/L   Potassium 3.1 (L) 3.5 - 5.1 mmol/L   Chloride 107 101 - 111 mmol/L   CO2 26 22 - 32 mmol/L   Glucose, Bld 103 (H) 65 - 99 mg/dL   BUN 16 6 - 20 mg/dL   Creatinine, Ser 0.94 0.44 - 1.00 mg/dL   Calcium 8.9 8.9 - 10.3 mg/dL   GFR calc non Af Amer >60 >60 mL/min   GFR calc Af Amer >60 >60 mL/min   Anion gap 4 (L) 5 - 15  Urinalysis, Routine w reflex microscopic (not at [facility])     Status: None   Collection Time: 11/18/14 12:02 AM  Result Value Ref Range   Color, Urine YELLOW YELLOW   APPearance CLEAR CLEAR   Specific Gravity, Urine 1.022 1.005 - 1.030   pH 5.5 5.0 - 8.0   Glucose, UA NEGATIVE NEGATIVE mg/dL   Hgb urine dipstick NEGATIVE NEGATIVE   Bilirubin Urine NEGATIVE NEGATIVE   Ketones, ur NEGATIVE NEGATIVE mg/dL   Protein, ur NEGATIVE NEGATIVE mg/dL   Urobilinogen, UA 0.2 0.0 - 1.0 mg/dL   Nitrite NEGATIVE NEGATIVE   Leukocytes, UA NEGATIVE NEGATIVE    Imaging Studies: Ct Head Wo Contrast  11/18/2014  CLINICAL DATA:  Sudden onset of near syncope and numbness. Episode of lightheadedness with blurry vision, right upper extremity, left hand and facial numbness. EXAM: CT HEAD WITHOUT CONTRAST TECHNIQUE: Contiguous axial images were obtained from the base of the skull through the vertex without intravenous contrast. COMPARISON:  Head CT 01/31/2013 FINDINGS: No intracranial hemorrhage, mass effect, or midline shift. No hydrocephalus. The basilar cisterns are patent. No evidence of territorial infarct. No intracranial fluid collection. Calvarium is intact. Included paranasal sinuses and mastoid air cells are well aerated. IMPRESSION: No  acute intracranial abnormality. Electronically Signed   By: Fonnie Birkenhead.D.  On: 11/18/2014 00:30   The patient's symptomatology is consistent with a panic attack but she has no history of panic attacks. It is noted that she is under increased stress. She was advised of lab, CT and EKG findings. She was advised to follow-up with her primary care physician.  I personally performed the services described in this documentation, which was scribed in my presence. The recorded information has been reviewed and is accurate.   Shanon Rosser, MD 11/18/14 [phone]

## 2014-11-17 NOTE — ED Notes (Signed)
Patient states that about 20 minutes ago she was talking to her husband and her vision became blurred and her tongue got numb and her fingers and toes were numb. Denies any pain but reports that she starting to get a HA

## 2014-11-17 NOTE — ED Notes (Signed)
Patient has WNL neuro assessment, grips and pupils are equal on assessment. Patent is taking in full sentences and answer telephone and tell family what is going on during the triage process.

## 2014-11-18 LAB — CBC WITH DIFFERENTIAL/PLATELET
BASOS PCT: 0 %
Basophils Absolute: 0 10*3/uL (ref 0.0–0.1)
EOS PCT: 3 %
Eosinophils Absolute: 0.2 10*3/uL (ref 0.0–0.7)
HCT: 40.5 % (ref 36.0–46.0)
Hemoglobin: 13.6 g/dL (ref 12.0–15.0)
LYMPHS ABS: 2 10*3/uL (ref 0.7–4.0)
Lymphocytes Relative: 28 %
MCH: 31.1 pg (ref 26.0–34.0)
MCHC: 33.6 g/dL (ref 30.0–36.0)
MCV: 92.7 fL (ref 78.0–100.0)
MONOS PCT: 11 %
Monocytes Absolute: 0.8 10*3/uL (ref 0.1–1.0)
Neutro Abs: 4 10*3/uL (ref 1.7–7.7)
Neutrophils Relative %: 58 %
PLATELETS: 238 10*3/uL (ref 150–400)
RBC: 4.37 MIL/uL (ref 3.87–5.11)
RDW: 12.9 % (ref 11.5–15.5)
WBC: 7.1 10*3/uL (ref 4.0–10.5)

## 2014-11-18 LAB — BASIC METABOLIC PANEL
Anion gap: 4 — ABNORMAL LOW (ref 5–15)
BUN: 16 mg/dL (ref 6–20)
CALCIUM: 8.9 mg/dL (ref 8.9–10.3)
CO2: 26 mmol/L (ref 22–32)
Chloride: 107 mmol/L (ref 101–111)
Creatinine, Ser: 0.94 mg/dL (ref 0.44–1.00)
GFR calc non Af Amer: >60 mL/min (ref >60)
Glucose, Bld: 103 mg/dL — ABNORMAL HIGH (ref 65–99)
Potassium: 3.1 mmol/L — ABNORMAL LOW (ref 3.5–5.1)
SODIUM: 137 mmol/L (ref 135–145)

## 2014-11-18 LAB — URINALYSIS, ROUTINE W REFLEX MICROSCOPIC
BILIRUBIN URINE: NEGATIVE
GLUCOSE, UA: NEGATIVE mg/dL
HGB URINE DIPSTICK: NEGATIVE
Ketones, ur: NEGATIVE mg/dL
Leukocytes, UA: NEGATIVE
Nitrite: NEGATIVE
PROTEIN: NEGATIVE mg/dL
Specific Gravity, Urine: 1.022 (ref 1.005–1.030)
Urobilinogen, UA: 0.2 mg/dL (ref 0.0–1.0)
pH: 5.5 (ref 5.0–8.0)

## 2014-11-18 MED ORDER — POTASSIUM CHLORIDE CRYS ER 20 MEQ PO TBCR
40.0000 meq | EXTENDED_RELEASE_TABLET | Freq: Once | ORAL | Status: AC
  Administered 2014-11-18: 40 meq via ORAL
  Filled 2014-11-18: qty 2

## 2014-11-18 NOTE — ED Notes (Signed)
Pt returned from CT °

## 2014-11-18 NOTE — Discharge Instructions (Signed)

## 2014-11-18 NOTE — ED Notes (Signed)
Patient transported to CT 

## 2015-10-21 ENCOUNTER — Other Ambulatory Visit: Payer: Self-pay | Admitting: Obstetrics & Gynecology

## 2015-10-21 DIAGNOSIS — M79622 Pain in left upper arm: Secondary | ICD-10-CM

## 2015-11-24 ENCOUNTER — Other Ambulatory Visit: Payer: 59

## 2016-04-18 DIAGNOSIS — Z1231 Encounter for screening mammogram for malignant neoplasm of breast: Secondary | ICD-10-CM | POA: Diagnosis not present

## 2016-04-18 DIAGNOSIS — Z803 Family history of malignant neoplasm of breast: Secondary | ICD-10-CM | POA: Diagnosis not present

## 2016-04-21 ENCOUNTER — Emergency Department (HOSPITAL_BASED_OUTPATIENT_CLINIC_OR_DEPARTMENT_OTHER): Payer: 59

## 2016-04-21 ENCOUNTER — Emergency Department (HOSPITAL_BASED_OUTPATIENT_CLINIC_OR_DEPARTMENT_OTHER)
Admission: EM | Admit: 2016-04-21 | Discharge: 2016-04-21 | Disposition: A | Discharge status: Home / Self Care | Payer: 59 | Attending: Emergency Medicine | Admitting: Emergency Medicine

## 2016-04-21 ENCOUNTER — Encounter (HOSPITAL_BASED_OUTPATIENT_CLINIC_OR_DEPARTMENT_OTHER): Payer: Self-pay | Admitting: *Deleted

## 2016-04-21 DIAGNOSIS — M545 Low back pain, unspecified: Secondary | ICD-10-CM

## 2016-04-21 DIAGNOSIS — R1031 Right lower quadrant pain: Secondary | ICD-10-CM

## 2016-04-21 DIAGNOSIS — R1111 Vomiting without nausea: Secondary | ICD-10-CM | POA: Diagnosis not present

## 2016-04-21 LAB — CBC
HEMATOCRIT: 43.7 % (ref 36.0–46.0)
HEMOGLOBIN: 15.1 g/dL — AB (ref 12.0–15.0)
MCH: 31.8 pg (ref 26.0–34.0)
MCHC: 34.6 g/dL (ref 30.0–36.0)
MCV: 92 fL (ref 78.0–100.0)
Platelets: 246 10*3/uL (ref 150–400)
RBC: 4.75 MIL/uL (ref 3.87–5.11)
RDW: 13 % (ref 11.5–15.5)
WBC: 8.9 10*3/uL (ref 4.0–10.5)

## 2016-04-21 LAB — URINALYSIS, ROUTINE W REFLEX MICROSCOPIC
BILIRUBIN URINE: NEGATIVE
GLUCOSE, UA: NEGATIVE mg/dL
Hgb urine dipstick: NEGATIVE
KETONES UR: 15 mg/dL — AB
LEUKOCYTES UA: NEGATIVE
Nitrite: NEGATIVE
PROTEIN: NEGATIVE mg/dL
Specific Gravity, Urine: 1.024 (ref 1.005–1.030)
pH: 7.5 (ref 5.0–8.0)

## 2016-04-21 LAB — COMPREHENSIVE METABOLIC PANEL
ALBUMIN: 4.3 g/dL (ref 3.5–5.0)
ALK PHOS: 73 U/L (ref 38–126)
ALT: 16 U/L (ref 14–54)
AST: 18 U/L (ref 15–41)
Anion gap: 7 (ref 5–15)
BILIRUBIN TOTAL: 0.6 mg/dL (ref 0.3–1.2)
BUN: 16 mg/dL (ref 6–20)
CO2: 26 mmol/L (ref 22–32)
CREATININE: 0.8 mg/dL (ref 0.44–1.00)
Calcium: 9.7 mg/dL (ref 8.9–10.3)
Chloride: 106 mmol/L (ref 101–111)
GFR calc Af Amer: >60 mL/min (ref >60)
GFR calc non Af Amer: >60 mL/min (ref >60)
GLUCOSE: 103 mg/dL — AB (ref 65–99)
POTASSIUM: 3.2 mmol/L — AB (ref 3.5–5.1)
Sodium: 139 mmol/L (ref 135–145)
TOTAL PROTEIN: 7.4 g/dL (ref 6.5–8.1)

## 2016-04-21 LAB — URINALYSIS, MICROSCOPIC (REFLEX)

## 2016-04-21 LAB — LIPASE, BLOOD: LIPASE: 26 U/L (ref 11–51)

## 2016-04-21 MED ORDER — SODIUM CHLORIDE 0.9 % IV SOLN: 1000.0000 mL | INTRAVENOUS | Status: DC

## 2016-04-21 MED ORDER — SODIUM CHLORIDE 0.9 % IV SOLN
1000.0000 mL | Freq: Once | INTRAVENOUS | Status: AC
  Administered 2016-04-21: 1000 mL via INTRAVENOUS

## 2016-04-21 MED ORDER — ONDANSETRON HCL 4 MG/2ML IJ SOLN
4.0000 mg | Freq: Once | INTRAMUSCULAR | Status: AC
  Administered 2016-04-21: 4 mg via INTRAVENOUS
  Filled 2016-04-21: qty 2

## 2016-04-21 MED ORDER — IBUPROFEN 600 MG PO TABS: 600.0000 mg | ORAL_TABLET | Freq: Four times a day (QID) | ORAL | 0 refills | Status: DC | PRN

## 2016-04-21 MED ORDER — ONDANSETRON 4 MG PO TBDP: 4.0000 mg | ORAL_TABLET | ORAL | 0 refills | Status: DC | PRN

## 2016-04-21 MED ORDER — ORPHENADRINE CITRATE ER 100 MG PO TB12: 100.0000 mg | ORAL_TABLET | Freq: Two times a day (BID) | ORAL | 0 refills | Status: DC

## 2016-04-21 MED ORDER — KETOROLAC TROMETHAMINE 30 MG/ML IJ SOLN
30.0000 mg | Freq: Once | INTRAMUSCULAR | Status: AC
  Administered 2016-04-21: 30 mg via INTRAVENOUS
  Filled 2016-04-21: qty 1

## 2016-04-21 NOTE — Discharge Instructions (Signed)
There is a small cyst on your liver. This does not appear to have any relationship to your pain today. It was seen on a previous study. This is likely benign but make sure that your primary care doctor is aware of it and continues appropriate surveillance.

## 2016-04-21 NOTE — ED Provider Notes (Signed)
[facility] Provider Note   CSN: [id number] Arrival date & time: 04/21/16  1854   By signing my name below, I, Charolotte Eke, attest that this documentation has been prepared under the direction and in the presence of Charlesetta Shanks, MD. Electronically Signed: Charolotte Eke, Scribe. 04/21/16. 9:11 PM.    History   Chief Complaint Chief Complaint  Patient presents with  . Back Pain  . Abdominal Pain    HPI Margaret Carney is a 56 y.o. female with h/o heart murmur who presents to the Emergency Department complaining of sudden onset, constant, worsening right sided back pain when standing in the bathroom 14 hours ago. Pt has associated nausea, multiple episodes of emesis, abdominal pain. Pt is allergic to Codeine. Pt had kidney stone surgery 05/29/24 and was told that there is a stone located on the right side of her kidney's still. Pt states that it does not hurt in her bones at all. Pt denies leg pain.    The history is provided by the patient. No language interpreter was used.    Past Medical History:  Diagnosis Date  . Allergy    seasonal  . Complication of anesthesia   . Cushing's disease ([place])   . Diverticulosis 2013  . GERD (gastroesophageal reflux disease)   . Headache    migraines  . Heart murmur   . Hx of colonic polyps 2013   Tubular Adenoma 2 mm rectal  . Internal and external hemorrhoids without complication 6213  . Kidney stone on right side   . MVP (mitral valve prolapse)   . Osteopenia   . PONV (postoperative nausea and vomiting)    needs Scoploamine patch and meds. to prevent nausea- had surgery 05/30/2014 and was given these    Patient Active Problem List   Diagnosis Date Noted  . Left ureteral stone 05/30/2014  . 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 Hemorrhoids, internal, with bleeding 11/29/2013  . Chronic migraine 04/03/2013  . Personal history of colonic polyps-rectal adenoma 05/26/2011    Past Surgical History:  Procedure Laterality Date    . BREAST SURGERY     right breast biopsy-benign  . COLONOSCOPY W/ BIOPSIES  2013  . CYSTOSCOPY Left 05/30/2014   Procedure: CYSTOSCOPY/LEFT RETROGRADE PYELOGRAM/PLACEMENT URETERAL STENT;  Surgeon: Alexis Frock, MD;  Location: [facility];  Service: Urology;  Laterality: Left;  . CYSTOSCOPY/URETEROSCOPY/HOLMIUM LASER/STENT PLACEMENT Left 06/24/2014   Procedure: CYSTOSCOPY, BILATERAL URETEROSCOPY, BILATERAL STENT PLACEMENT;  Surgeon: Festus Aloe, MD;  Location: [facility];  Service: Urology;  Laterality: Left;  . HOLMIUM LASER APPLICATION Left 0/86/5784   Procedure: HOLMIUM LASER LITHOTRIPSY ;  Surgeon: Festus Aloe, MD;  Location: [facility];  Service: Urology;  Laterality: Left;  . LAPAROSCOPIC ENDOMETRIOSIS FULGURATION  1989  . LITHOTRIPSY     Nephrolithiasis  . pitituary tumor  1989  . TONSILLECTOMY AND ADENOIDECTOMY  1968    OB History    No data available       Home Medications    Prior to Admission medications   Medication Sig Start Date End Date Taking? Authorizing Provider  Multiple Vitamin (MULTIVITAMIN WITH MINERALS) TABS tablet Take 1 tablet by mouth at bedtime.   Yes Historical Provider, MD  aspirin-acetaminophen-caffeine (EXCEDRIN MIGRAINE) [phone] MG per tablet Take 1 tablet by mouth every 8 (eight) hours as needed for headache.    Historical Provider, MD  Calcium-Vitamin D-Vitamin K (CALCIUM SOFT CHEWS PO) Take 1 each by mouth at bedtime.     Historical Provider, MD  ibuprofen (  ADVIL,MOTRIN) 200 MG tablet Take 2 tablets (400 mg total) by mouth every 6 (six) hours as needed. For pain Patient taking differently: Take 400 mg by mouth every 6 (six) hours as needed for headache or moderate pain. For pain  06/16/11   Festus Aloe, MD  ibuprofen (ADVIL,MOTRIN) 600 MG tablet Take 1 tablet (600 mg total) by mouth every 6 (six) hours as needed. 04/21/16   Charlesetta Shanks, MD  ondansetron (ZOFRAN ODT) 4 MG disintegrating tablet Take 1 tablet (4 mg total) by mouth every 4 (four)  hours as needed for nausea or vomiting. 04/21/16   Charlesetta Shanks, MD  orphenadrine (NORFLEX) 100 MG tablet Take 1 tablet (100 mg total) by mouth 2 (two) times daily. 04/21/16   Charlesetta Shanks, MD  oxyCODONE-acetaminophen (ROXICET) 5-325 MG per tablet Take 1-2 tablets by mouth every 8 (eight) hours as needed for moderate pain or severe pain. Post-operatively Patient not taking: Reported on 06/12/2014 05/31/14   Alexis Frock, MD  oxyCODONE-acetaminophen (ROXICET) 5-325 MG per tablet Take 1-2 tablets by mouth every 6 (six) hours as needed for severe pain. 06/24/14   Festus Aloe, MD  Polyethyl Glycol-Propyl Glycol (SYSTANE OP) Apply 1-2 drops to eye daily as needed (dry eyes.).    Historical Provider, MD  promethazine (PHENERGAN) 12.5 MG tablet Take 1 tablet (12.5 mg total) by mouth every 6 (six) hours as needed for nausea or vomiting. 06/24/14   Festus Aloe, MD  senna-docusate (SENOKOT S) 8.6-50 MG per tablet Take 1 tablet by mouth 2 (two) times daily. While taking pain meds to prevent constipation. Patient taking differently: Take 1 tablet by mouth 2 (two) times daily as needed for mild constipation.  05/31/14   Alexis Frock, MD  sulfamethoxazole-trimethoprim (BACTRIM DS,SEPTRA DS) 800-160 MG per tablet Take 1 tablet by mouth at bedtime. 06/24/14   Festus Aloe, MD  SUMAtriptan (IMITREX) 50 MG tablet Take one tablet at headache onset, if no improvement, may repeat in 2 hours if headache persists or recurs. 04/03/13   Alda Berthold, DO    Family History Family History  Problem Relation Age of Onset  . Colon polyps Mother   . Hypertension Mother     Living, 46  . Dementia Mother   . Breast cancer Sister   . Diabetes Father     Living, 74  . Colon cancer Neg Hx   . Stomach cancer Neg Hx   . Ulcerative colitis Neg Hx   . Esophageal cancer Neg Hx     Social History Social History  Substance Use Topics  . Smoking status: Never Smoker  . Smokeless tobacco: Never Used  . Alcohol use  No     Allergies   Codeine   Review of Systems Review of Systems  Gastrointestinal: Positive for abdominal pain.  Musculoskeletal: Positive for back pain.    A complete 10 system review of systems was obtained and all systems are negative except as noted in the HPI and PMH.   Physical Exam Updated Vital Signs BP 119/69   Pulse (!) 40   Temp 98.3 F (36.8 C) (Oral)   Resp 16   Ht 5\' 8"  (1.727 m)   Wt 120 lb (54.4 kg)   LMP 09/08/2010   SpO2 98%   BMI 18.25 kg/m   Physical Exam  Constitutional: She is oriented to person, place, and time. She appears well-developed and well-nourished.  HENT:  Head: Normocephalic and atraumatic.  Cardiovascular: Normal rate, regular rhythm, normal heart sounds and intact distal  pulses.  Exam reveals no gallop and no friction rub.   No murmur heard. Pulmonary/Chest: Effort normal and breath sounds normal. She has no wheezes. She has no rales.  Abdominal: Soft. She exhibits no distension and no mass. There is tenderness. There is no guarding.  No CVA tenderness to percussion. Mild RLQ tenderness. No Mass, no guarding. Upper abdomen soft and non-tender.  Musculoskeletal:  Mild reproducible SI joint and iliac crest TPP right greater than left. No lower extremity edema. Calves soft non-tender.  Neurological: She is alert and oriented to person, place, and time. No cranial nerve deficit. She exhibits normal muscle tone. Coordination normal.  Skin: Skin is warm and dry.  Psychiatric: She has a normal mood and affect.  Nursing note and vitals reviewed.    ED Treatments / Results   DIAGNOSTIC STUDIES: Oxygen Saturation is 100% on room air, normal by my interpretation.    COORDINATION OF CARE: 9:09 PM Discussed treatment plan with pt at bedside and pt agreed to plan.    Labs (all labs ordered are listed, but only abnormal results are displayed) Labs Reviewed  URINALYSIS, ROUTINE W REFLEX MICROSCOPIC - Abnormal; Notable for the  following:       Result Value   APPearance TURBID (*)    Ketones, ur 15 (*)    All other components within normal limits  URINALYSIS, MICROSCOPIC (REFLEX) - Abnormal; Notable for the following:    Bacteria, UA RARE (*)    Squamous Epithelial / LPF 0-5 (*)    All other components within normal limits  CBC - Abnormal; Notable for the following:    Hemoglobin 15.1 (*)    All other components within normal limits  COMPREHENSIVE METABOLIC PANEL - Abnormal; Notable for the following:    Potassium 3.2 (*)    Glucose, Bld 103 (*)    All other components within normal limits  LIPASE, BLOOD    EKG  EKG Interpretation None       Radiology No results found.  Procedures Procedures (including critical care time)  Medications Ordered in ED Medications  0.9 %  sodium chloride infusion (0 mLs Intravenous Stopped 04/21/16 2159)  ondansetron (ZOFRAN) injection 4 mg (4 mg Intravenous Given 04/21/16 2222)  ketorolac (TORADOL) 30 MG/ML injection 30 mg (30 mg Intravenous Given 04/21/16 2226)     Initial Impression / Assessment and Plan / ED Course  I have reviewed the triage vital signs and the nursing notes.  Pertinent labs & imaging results that were available during my care of the patient were reviewed by me and considered in my medical decision making (see chart for details).      Final Clinical Impressions(s) / ED Diagnoses   Final diagnoses:  Acute right-sided low back pain without sciatica  Right lower quadrant abdominal pain  Patient had acute onset of severe right-sided low back pain and associated abdominal pain. Consideration was given to kidney stone, herniated disc or muscular strain. Patient had a well before the symptoms started. CT shows no acute anomalies except likely incidental, benign hepatic hypodensity. Patient is made aware of this finding but it has no association with today's presentation. No signs of UTI. Patient does not have neurologic dysfunction. At this time,  I suspect acute fairly severe onset of pain that is musculoskeletal possibly with muscle spasm or disc herniation without any neurologic dysfunction. Patient's vomiting may have been in association with severe pain. Symptoms had significantly abated while treated in the emergency department. Plan will be to  continue to treat for muscular skeletal pain with muscle relaxer anti-inflammatory and anti-medic if needed. Signs for which to  return are reviewed.  New Prescriptions Discharge Medication List as of 04/21/2016 10:19 PM    START taking these medications   Details  !! ibuprofen (ADVIL,MOTRIN) 600 MG tablet Take 1 tablet (600 mg total) by mouth every 6 (six) hours as needed., Starting Thu 04/21/2016, Print    ondansetron (ZOFRAN ODT) 4 MG disintegrating tablet Take 1 tablet (4 mg total) by mouth every 4 (four) hours as needed for nausea or vomiting., Starting Thu 04/21/2016, Print    orphenadrine (NORFLEX) 100 MG tablet Take 1 tablet (100 mg total) by mouth 2 (two) times daily., Starting Thu 04/21/2016, Print     !! - Potential duplicate medications found. Please discuss with provider.        Charlesetta Shanks, MD 04/26/16 [phone]

## 2016-04-21 NOTE — ED Triage Notes (Signed)
Back pain since this am. Vomiting and headache. States she has a known kidney stone that her MD found a couple of years ago.

## 2016-04-21 NOTE — ED Notes (Signed)
Patient transported to CT 

## 2016-04-27 ENCOUNTER — Telehealth: Payer: Self-pay | Admitting: Internal Medicine

## 2016-04-27 NOTE — Telephone Encounter (Signed)
Pt scheduled to see Dr. Carlean Purl 04/28/16@3 :30pm, pt aware of appt.

## 2016-04-27 NOTE — Telephone Encounter (Signed)
Pt states she was seen in the ER last week for pain on the right side of her back with N/V. They thought she could have had a kidney stone but did not see one. Pt states now she is having pain in her RLQ and it hurts more after she eats. She is also having some constipation. States she took some dulcolax and miralax but only had a little watery stool. Discussed with pt that she can take up to 3 doses of miralax to have BM. Pt wanted to know if Dr. Carlean Purl may have an idea of what the pain might be from. She is scheduled to see Nicoletta Ba PA 05/10/16. Explained we really cannot tell over the phone regarding her discomfort. Dr. Carlean Purl please advise regarding any further recommendations.

## 2016-04-27 NOTE — Telephone Encounter (Signed)
I am willing to see her tomorrow - we can work her in to a hidden spot

## 2016-04-28 ENCOUNTER — Encounter: Payer: Self-pay | Admitting: Internal Medicine

## 2016-04-28 ENCOUNTER — Ambulatory Visit (INDEPENDENT_AMBULATORY_CARE_PROVIDER_SITE_OTHER): Payer: 59 | Admitting: Internal Medicine

## 2016-04-28 VITALS — BP 124/96 | HR 72 | Ht 67.0 in | Wt 147.4 lb

## 2016-04-28 DIAGNOSIS — K581 Irritable bowel syndrome with constipation: Secondary | ICD-10-CM

## 2016-04-28 DIAGNOSIS — K6289 Other specified diseases of anus and rectum: Secondary | ICD-10-CM | POA: Diagnosis not present

## 2016-04-28 DIAGNOSIS — R1031 Right lower quadrant pain: Secondary | ICD-10-CM

## 2016-04-28 DIAGNOSIS — R10813 Right lower quadrant abdominal tenderness: Secondary | ICD-10-CM | POA: Diagnosis not present

## 2016-04-28 MED ORDER — DICYCLOMINE HCL 20 MG PO TABS: 20.0000 mg | ORAL_TABLET | Freq: Four times a day (QID) | ORAL | 0 refills | Status: DC | PRN

## 2016-04-28 MED ORDER — AMOXICILLIN-POT CLAVULANATE 875-125 MG PO TABS: 1.0000 | ORAL_TABLET | Freq: Two times a day (BID) | ORAL | 0 refills | Status: DC

## 2016-04-28 MED ORDER — HYDROCORTISONE ACETATE 25 MG RE SUPP: 25.0000 mg | Freq: Every day | RECTAL | 0 refills | Status: DC | PRN

## 2016-04-28 NOTE — Progress Notes (Signed)
Margaret Carney 56 y.o. [birth year] [id number]  Assessment & Plan:   Encounter Diagnoses  Name Primary?  . Abdominal pain, RLQ Yes  . Right lower quadrant abdominal tenderness without rebound tenderness   . Rectal pain   . Irritable bowel syndrome with constipation      She could have a mild diverticulitis case. Could be irritable bowel recent viral syndrome. We talked about the options and decided to treat for one week with Augmentin as well as dicyclomine when necessary. For the rectal symptoms which may be a mild fissure based upon my exam she says Anusol HC suppositories of helped in the past so I will refill those. If she fails to improve could need a colonoscopy sooner than she did in the past. She had a 2 mm rectal adenoma in 2013 so I was planning on routine repeat surveillance in 2020. She also had diverticulosis.  Subjective:   Chief Complaint: Right lower quadrant pain  HPI The patient is a very nice 56 year old white woman, a week ago she started to have some low back pressure on the right, had been constipated more than usual, went home early from work and had vomiting. That lasted for 24 hours or so no other sick contacts. She didn't feel right she was having trouble moving her bowels, she went to the emergency department as she thought maybe she was having another kidney stone attack. Re-20 03/22/2016 note and CT scan reviewed. Images reviewed as well. No CT findings of kidney stone no diverticulitis or other things mentioned. No hematuria. CBC normal except for mildly hemoconcentrated with a hemoglobin of 15. She is a little better but still feels tender in the right lower quadrant is wondering what going on. Bowels have not started to move back in the normal fashion, she will use MiraLAX on a daily basis typically but had backed off of it because she also had had some diarrhea transiently. No fevers. No dysuria. Does have some mild rectal discomfort which is been a problem in  the past before relieved by Anusol HC. Some question as to whether or not she might have had proctalgia fugax versus hemorrhoids etc. Oh rectal bleeding at this time.  Allergies  Allergen Reactions  . Codeine Itching   Current Meds  Medication Sig  . aspirin-acetaminophen-caffeine (EXCEDRIN MIGRAINE) 250-250-65 MG per tablet Take 1 tablet by mouth every 8 (eight) hours as needed for headache.  . Calcium-Vitamin D-Vitamin K (CALCIUM SOFT CHEWS PO) Take 1 each by mouth at bedtime.   . Multiple Vitamin (MULTIVITAMIN WITH MINERALS) TABS tablet Take 1 tablet by mouth at bedtime.  . ondansetron (ZOFRAN ODT) 4 MG disintegrating tablet Take 1 tablet (4 mg total) by mouth every 4 (four) hours as needed for nausea or vomiting.  Vladimir Faster Glycol-Propyl Glycol (SYSTANE OP) Apply 1-2 drops to eye daily as needed (dry eyes.).  Marland Kitchen senna-docusate (SENOKOT S) 8.6-50 MG per tablet Take 1 tablet by mouth 2 (two) times daily. While taking pain meds to prevent constipation. (Patient taking differently: Take 1 tablet by mouth 2 (two) times daily as needed for mild constipation. )   Past Medical History:  Diagnosis Date  . Allergy    seasonal  . Complication of anesthesia   . Cushing's disease ([place])   . Diverticulosis 2013  . GERD (gastroesophageal reflux disease)   . Headache    migraines  . Heart murmur   . Hx of colonic polyps 2013   Tubular Adenoma 2 mm rectal  .  Internal and external hemorrhoids without complication 6222  . Kidney stone on right side   . MVP (mitral valve prolapse)   . Osteopenia   . PONV (postoperative nausea and vomiting)    needs Scoploamine patch and meds. to prevent nausea- had surgery 05/30/2014 and was given these   Past Surgical History:  Procedure Laterality Date  . BREAST SURGERY     right breast biopsy-benign  . COLONOSCOPY W/ BIOPSIES  2013  . CYSTOSCOPY Left 05/30/2014   Procedure: CYSTOSCOPY/LEFT RETROGRADE PYELOGRAM/PLACEMENT URETERAL STENT;  Surgeon: Alexis Frock, MD;  Location: [facility];  Service: Urology;  Laterality: Left;  . CYSTOSCOPY/URETEROSCOPY/HOLMIUM LASER/STENT PLACEMENT Left 06/24/2014   Procedure: CYSTOSCOPY, BILATERAL URETEROSCOPY, BILATERAL STENT PLACEMENT;  Surgeon: Festus Aloe, MD;  Location: [facility];  Service: Urology;  Laterality: Left;  . HOLMIUM LASER APPLICATION Left 9/79/8921   Procedure: HOLMIUM LASER LITHOTRIPSY ;  Surgeon: Festus Aloe, MD;  Location: [facility];  Service: Urology;  Laterality: Left;  . LAPAROSCOPIC ENDOMETRIOSIS FULGURATION  1989  . LITHOTRIPSY     Nephrolithiasis  . pitituary tumor  1989  . TONSILLECTOMY AND ADENOIDECTOMY  1968    Review of Systems As above  Objective:   Physical Exam @BP  (!) 124/96 (BP Location: Left Arm, Patient Position: Sitting, Cuff Size: Normal)   Pulse 72   Ht 5\' 7"  (1.702 m) Comment: height measured without shoes  Wt 147 lb 6 oz (66.8 kg)   LMP 09/08/2010   BMI 23.08 kg/m @  General:  NAD Eyes:   anicteric Lungs:  clear Heart::  S1S2 no rubs, murmurs or gallops Abdomen:  soft and out the moderately tender in right lower quadrant without rebound, BS+ Ext:   no edema, cyanosis or clubbing    Data Reviewed:  See above

## 2016-04-28 NOTE — Patient Instructions (Addendum)
  We are canceling your appointment with Amy Esterwood PA-C.    We have sent medications to your pharmacy for you to pick up at your convenience.   Re-start your miralax and use a soft bland diet.   I appreciate the opportunity to care for you. Silvano Rusk, MD, [facility]

## 2016-05-10 ENCOUNTER — Ambulatory Visit: Payer: 59 | Admitting: Physician Assistant

## 2016-08-24 DIAGNOSIS — H16211 Exposure keratoconjunctivitis, right eye: Secondary | ICD-10-CM | POA: Diagnosis not present

## 2016-08-27 DIAGNOSIS — G43909 Migraine, unspecified, not intractable, without status migrainosus: Secondary | ICD-10-CM | POA: Diagnosis not present

## 2016-09-08 DIAGNOSIS — Z13220 Encounter for screening for lipoid disorders: Secondary | ICD-10-CM | POA: Diagnosis not present

## 2016-09-08 DIAGNOSIS — Z0000 Encounter for general adult medical examination without abnormal findings: Secondary | ICD-10-CM | POA: Diagnosis not present

## 2016-09-22 ENCOUNTER — Encounter (INDEPENDENT_AMBULATORY_CARE_PROVIDER_SITE_OTHER): Payer: Self-pay

## 2016-09-22 ENCOUNTER — Encounter: Payer: Self-pay | Admitting: Internal Medicine

## 2016-09-22 ENCOUNTER — Ambulatory Visit (INDEPENDENT_AMBULATORY_CARE_PROVIDER_SITE_OTHER): Payer: 59 | Admitting: Internal Medicine

## 2016-09-22 VITALS — BP 126/68 | HR 82 | Ht 67.0 in | Wt 147.0 lb

## 2016-09-22 DIAGNOSIS — F4320 Adjustment disorder, unspecified: Secondary | ICD-10-CM

## 2016-09-22 DIAGNOSIS — R1011 Right upper quadrant pain: Secondary | ICD-10-CM | POA: Diagnosis not present

## 2016-09-22 DIAGNOSIS — R10816 Epigastric abdominal tenderness: Secondary | ICD-10-CM | POA: Diagnosis not present

## 2016-09-22 DIAGNOSIS — F4321 Adjustment disorder with depressed mood: Secondary | ICD-10-CM

## 2016-09-22 DIAGNOSIS — R6881 Early satiety: Secondary | ICD-10-CM | POA: Diagnosis not present

## 2016-09-22 NOTE — Patient Instructions (Signed)
  You have been scheduled for an endoscopy. Please follow written instructions given to you at your visit today. If you use inhalers (even only as needed), please bring them with you on the day of your procedure. Your physician has requested that you go to www.startemmi.com and enter the access code given to you at your visit today. This web site gives a general overview about your procedure. However, you should still follow specific instructions given to you by our office regarding your preparation for the procedure.   Today we are giving you FDgard samples to try and a coupon to purchase more.   I appreciate the opportunity to care for you. Silvano Rusk, MD, [facility]

## 2016-09-22 NOTE — Progress Notes (Signed)
Margaret Carney 56 y.o. November 14, 1960 [id number]  Assessment & Plan:   Encounter Diagnoses  Name Primary?  . RUQ pain Yes  . Epigastric abdominal tenderness without rebound tenderness   . Early satiety   . GriefOver husband's recent death      Strong likelihood of functional GI disturbance. Given the symptoms and EGD to evaluate for other causes is reasonable. I don't think colonoscopy is necessary based upon the symptom complex. I had considered that in the spring but she is not reporting any more hemorrhoidal bleeding, and she only had a 2 mm rectal adenoma 2013 so had made a 56-10 year and split the difference and made a 7 year recall for her which I still think is okay. We will see pending clinical course.  EGD to evaluate the symptoms question ulcer disease, strongly suspect functional disorder The risks and benefits as well as alternatives of endoscopic procedure(s) have been discussed and reviewed. All questions answered. The patient agrees to proceed.  FD GardTrial the interim  BuSpar might be a good treatment depending upon clinical course  I appreciate the opportunity to care for this patient. CC: Kelton Pillar, MD    Subjective:   Chief Complaint:Abdominal pain nausea and bloating  HPI The patient is a 56 year old white woman, recently widowed, with a recurrent or persistent problem of abdominal pain. I had seen her because of right lower quadrant pain in March, treated her empirically with some antibiotics which helped. That was lower. Now she's having right upper and maybe some epigastric symptoms of dull pain particularly after eating and fullness after meals with early satiety and inability to eat as much she did. There some nausea. Bowel habits slightly irregular but stable no changes. She hasn't tried anything to treat in particular for this. Allergies  Allergen Reactions  . Codeine Itching   Current Meds  Medication Sig  . aspirin-acetaminophen-caffeine  (EXCEDRIN MIGRAINE) 250-250-65 MG per tablet Take 1 tablet by mouth every 8 (eight) hours as needed for headache.  . Calcium-Vitamin D-Vitamin K (CALCIUM SOFT CHEWS PO) Take 1 each by mouth at bedtime.   . hydrocortisone (ANUSOL-HC) 25 MG suppository Place 1 suppository (25 mg total) rectally daily as needed for hemorrhoids or itching.  Marland Kitchen ibuprofen (ADVIL,MOTRIN) 600 MG tablet Take 1 tablet (600 mg total) by mouth every 6 (six) hours as needed.  . Multiple Vitamin (MULTIVITAMIN WITH MINERALS) TABS tablet Take 1 tablet by mouth at bedtime.  . ondansetron (ZOFRAN ODT) 4 MG disintegrating tablet Take 1 tablet (4 mg total) by mouth every 4 (four) hours as needed for nausea or vomiting.  Vladimir Faster Glycol-Propyl Glycol (SYSTANE OP) Apply 1-2 drops to eye daily as needed (dry eyes.).  Marland Kitchen senna-docusate (SENOKOT S) 8.6-50 MG per tablet Take 1 tablet by mouth 2 (two) times daily. While taking pain meds to prevent constipation. (Patient taking differently: Take 1 tablet by mouth 2 (two) times daily as needed for mild constipation. )  . SUMAtriptan (IMITREX) 50 MG tablet Take one tablet at headache onset, if no improvement, may repeat in 2 hours if headache persists or recurs.   Past Medical History:  Diagnosis Date  . Allergy    seasonal  . Complication of anesthesia   . Cushing's disease ([place])   . Diverticulosis 2013  . GERD (gastroesophageal reflux disease)   . Headache    migraines  . Heart murmur   . Hx of colonic polyps 2013   Tubular Adenoma 2 mm rectal  .  Internal and external hemorrhoids without complication 7035  . Kidney stone on right side   . MVP (mitral valve prolapse)   . Osteopenia   . PONV (postoperative nausea and vomiting)    needs Scoploamine patch and meds. to prevent nausea- had surgery 05/30/2014 and was given these   Past Surgical History:  Procedure Laterality Date  . BREAST SURGERY     right breast biopsy-benign  . COLONOSCOPY W/ BIOPSIES  2013  . CYSTOSCOPY Left  05/30/2014   Procedure: CYSTOSCOPY/LEFT RETROGRADE PYELOGRAM/PLACEMENT URETERAL STENT;  Surgeon: Alexis Frock, MD;  Location: [facility];  Service: Urology;  Laterality: Left;  . CYSTOSCOPY/URETEROSCOPY/HOLMIUM LASER/STENT PLACEMENT Left 06/24/2014   Procedure: CYSTOSCOPY, BILATERAL URETEROSCOPY, BILATERAL STENT PLACEMENT;  Surgeon: Festus Aloe, MD;  Location: [facility];  Service: Urology;  Laterality: Left;  . HOLMIUM LASER APPLICATION Left 0/10/3816   Procedure: HOLMIUM LASER LITHOTRIPSY ;  Surgeon: Festus Aloe, MD;  Location: [facility];  Service: Urology;  Laterality: Left;  . LAPAROSCOPIC ENDOMETRIOSIS FULGURATION  1989  . LITHOTRIPSY     Nephrolithiasis  . pitituary tumor  1989  . TONSILLECTOMY AND ADENOIDECTOMY  1968   Social History   Social History  . Marital status: Married    Spouse name: N/A  . Number of children: 1  . Years of education: N/A   Occupational History  . Project Licensed conveyancer   Social History Main Topics  . Smoking status: Never Smoker  . Smokeless tobacco: Never Used  . Alcohol use No  . Drug use: No  . Sexual activity: No   Other Topics Concern  . Not on file   Social History Narrative   Lives with husband.  They have one son.   She works as a Government social research officer.   Highest level of education:  Two years of college   family history includes Breast cancer in her sister; Colon polyps in her mother; Dementia in her mother; Diabetes in her father; Hypertension in her mother.   Review of Systems As per history of present illness she is grieving over her husband's death  Objective:   Physical Exam @BP  126/68   Pulse 82   Ht 5\' 7"  (1.702 m)   Wt 147 lb (66.7 kg)   LMP 09/08/2010   BMI 23.02 kg/m @  General:  Well-developed, well-nourished and in no acute distress Eyes:  anicteric. Lungs: Clear to auscultation bilaterally. Heart:  S1S2, no rubs, murmurs, gallops. Abdomen:  soft, mildly tender epigastrium, no hepatosplenomegaly,  hernia, or mass and BS+.  Lymph:  no cervical or supraclavicular adenopathy. Extremities:   no edema, cyanosis or clubbing Skin   no rash. Neuro:  A&O x 3.  Psych:  slightly flat   Data Reviewed:  Previous GI notes. Labs in the EMR. March 2018 labs negative. CT renal stone study negative in the spring. Does have diverticulosis on prior colonoscopy 2013. She had a 2 mm rectal adenoma so I had recommended a routine repeat in 2020 which is in the 5-10 year interval.

## 2016-10-10 ENCOUNTER — Encounter: Payer: Self-pay | Admitting: Internal Medicine

## 2016-10-18 DIAGNOSIS — Z13820 Encounter for screening for osteoporosis: Secondary | ICD-10-CM | POA: Diagnosis not present

## 2016-10-18 DIAGNOSIS — M8588 Other specified disorders of bone density and structure, other site: Secondary | ICD-10-CM | POA: Diagnosis not present

## 2016-10-21 ENCOUNTER — Encounter: Payer: Self-pay | Admitting: Internal Medicine

## 2016-10-21 ENCOUNTER — Ambulatory Visit (AMBULATORY_SURGERY_CENTER): Payer: 59 | Admitting: Internal Medicine

## 2016-10-21 VITALS — BP 124/76 | HR 57 | Temp 98.7°F | Resp 14 | Ht 67.0 in | Wt 147.0 lb

## 2016-10-21 DIAGNOSIS — R1011 Right upper quadrant pain: Secondary | ICD-10-CM | POA: Diagnosis present

## 2016-10-21 DIAGNOSIS — K3189 Other diseases of stomach and duodenum: Secondary | ICD-10-CM | POA: Diagnosis not present

## 2016-10-21 MED ORDER — SODIUM CHLORIDE 0.9 % IV SOLN
500.0000 mL | INTRAVENOUS | Status: DC
Start: 2016-10-21 — End: 2020-09-08

## 2016-10-21 NOTE — Progress Notes (Signed)
To PACU, VSS. Report to [email or web address] 

## 2016-10-21 NOTE — Patient Instructions (Addendum)
The stomach lining was checked for an infection that may be causing your symptoms. It is also possible that the stress you have been under is leading to changes in muscles and nerves in the gut and causing this.  Will let you know results and plans when I get the report. Am out of town next week but hope to get back to you towards end of week.  I appreciate the opportunity to care for you. Gatha Mayer, MD, FACG YOU HAD AN ENDOSCOPIC PROCEDURE TODAY AT [facility]:   Refer to the procedure report that was given to you for any specific questions about what was found during the examination.  If the procedure report does not answer your questions, please call your gastroenterologist to clarify.  If you requested that your care partner not be given the details of your procedure findings, then the procedure report has been included in a sealed envelope for you to review at your convenience later.  YOU SHOULD EXPECT: Some feelings of bloating in the abdomen. Passage of more gas than usual.  Walking can help get rid of the air that was put into your GI tract during the procedure and reduce the bloating. If you had a lower endoscopy (such as a colonoscopy or flexible sigmoidoscopy) you may notice spotting of blood in your stool or on the toilet paper. If you underwent a bowel prep for your procedure, you may not have a normal bowel movement for a few days.  Please Note:  You might notice some irritation and congestion in your nose or some drainage.  This is from the oxygen used during your procedure.  There is no need for concern and it should clear up in a day or so.  SYMPTOMS TO REPORT IMMEDIATELY:    Following upper endoscopy (EGD)  Vomiting of blood or coffee ground material  New chest pain or pain under the shoulder blades  Painful or persistently difficult swallowing  New shortness of breath  Fever of 100F or higher  Black, tarry-looking stools  For urgent or  emergent issues, a gastroenterologist can be reached at any hour by calling [phone].  May try the FDguard samples as directed.   DIET:  We do recommend a small meal at first, but then you may proceed to your regular diet.  Drink plenty of fluids but you should avoid alcoholic beverages for 24 hours.  ACTIVITY:  You should plan to take it easy for the rest of today and you should NOT DRIVE or use heavy machinery until tomorrow (because of the sedation medicines used during the test).    FOLLOW UP: Our staff will call the number listed on your records the next business day following your procedure to check on you and address any questions or concerns that you may have regarding the information given to you following your procedure. If we do not reach you, we will leave a message.  However, if you are feeling well and you are not experiencing any problems, there is no need to return our call.  We will assume that you have returned to your regular daily activities without incident.  If any biopsies were taken you will be contacted by phone or by letter within the next 1-3 weeks.  Please call us at [phone] if you have not heard about the biopsies in 3 weeks.    SIGNATURES/CONFIDENTIALITY: You and/or your care partner have signed paperwork which will be entered into your electronic  medical record.  These signatures attest to the fact that that the information above on your After Visit Summary has been reviewed and is understood.  Full responsibility of the confidentiality of this discharge information lies with you and/or your care-partner.  Thank you for letting us take care of your healthcare needs today.

## 2016-10-21 NOTE — Progress Notes (Signed)
Called to room to assist during endoscopic procedure.  Patient ID and intended procedure confirmed with present staff. Received instructions for my participation in the procedure from the performing physician.  

## 2016-10-21 NOTE — Op Note (Signed)
[facility] Patient Name: Margaret Carney Procedure Date: 10/21/2016 9:57 AM MRN: [id number] Endoscopist: Gatha Mayer , MD Age: 56 Referring MD:  Date of Birth: Jul 12, 1960 Gender: Female Account #: [id number] Procedure:                Upper GI endoscopy Indications:              Abdominal pain in the right upper quadrant Medicines:                Propofol per Anesthesia, Monitored Anesthesia Care Procedure:                Pre-Anesthesia Assessment:                           - Prior to the procedure, a History and Physical                            was performed, and patient medications and                            allergies were reviewed. The patient's tolerance of                            previous anesthesia was also reviewed. The risks                            and benefits of the procedure and the sedation                            options and risks were discussed with the patient.                            All questions were answered, and informed consent                            was obtained. Prior Anticoagulants: The patient has                            taken no previous anticoagulant or antiplatelet                            agents. ASA Grade Assessment: II - A patient with                            mild systemic disease. After reviewing the risks                            and benefits, the patient was deemed in                            satisfactory condition to undergo the procedure.                           After obtaining informed consent, the endoscope was  passed under direct vision. Throughout the                            procedure, the patient's blood pressure, pulse, and                            oxygen saturations were monitored continuously. The                            Endoscope was introduced through the mouth, and                            advanced to the second part of duodenum. The upper            GI endoscopy was accomplished without difficulty.                            The patient tolerated the procedure well. Scope In: Scope Out: Findings:                 Diffuse mild mucosal changes characterized by                            discoloration, erythema and a decreased vascular                            pattern were found in the gastric antrum. Biopsies                            were taken with a cold forceps for Helicobacter                            pylori testing using CLOtest. Verification of                            patient identification for the specimen was done.                            Estimated blood loss was minimal.                           The cardia and gastric fundus were normal on                            retroflexion.                           The exam was otherwise without abnormality. Complications:            No immediate complications. Estimated Blood Loss:     Estimated blood loss was minimal. Impression:               - Discolored, erythematous and decreased vascular                            pattern mucosa in the antrum. Biopsied.                           -  The examination was otherwise normal. Recommendation:           - Patient has a contact number available for                            emergencies. The signs and symptoms of potential                            delayed complications were discussed with the                            patient. Return to normal activities tomorrow.                            Written discharge instructions were provided to the                            patient.                           - Resume previous diet.                           - Continue present medications.                           - Await pathology results.                           - Treat H pylori if +                           Buspirone, SNRI might be useful Gatha Mayer, MD 10/21/2016 10:21:52 AM This report has been signed  electronically.

## 2016-10-24 ENCOUNTER — Telehealth: Payer: Self-pay | Admitting: *Deleted

## 2016-10-24 LAB — HELICOBACTER PYLORI SCREEN-BIOPSY: UREASE: NEGATIVE

## 2016-10-24 NOTE — Telephone Encounter (Signed)
  Follow up Call-  Call back number 10/21/2016  Post procedure Call Back phone  # [phone]  Permission to leave phone message Yes  Some recent data might be hidden     Patient questions:  Do you have a fever, pain , or abdominal swelling? No. Pain Score  0 *  Have you tolerated food without any problems? Yes.    Have you been able to return to your normal activities? Yes.    Do you have any questions about your discharge instructions: Diet   No. Medications  No. Follow up visit  No.  Do you have questions or concerns about your Care? No.  Actions: * If pain score is 4 or above: No action needed, pain <4.

## 2016-10-31 ENCOUNTER — Other Ambulatory Visit: Payer: Self-pay

## 2016-10-31 ENCOUNTER — Encounter: Payer: Self-pay | Admitting: Internal Medicine

## 2016-10-31 DIAGNOSIS — R1011 Right upper quadrant pain: Secondary | ICD-10-CM

## 2016-10-31 DIAGNOSIS — R1013 Epigastric pain: Secondary | ICD-10-CM

## 2016-10-31 DIAGNOSIS — R10811 Right upper quadrant abdominal tenderness: Secondary | ICD-10-CM

## 2016-10-31 DIAGNOSIS — R109 Unspecified abdominal pain: Secondary | ICD-10-CM

## 2016-10-31 NOTE — Progress Notes (Signed)
I called the negative H. pylori results to the patient. No recall or let her from [facility] needed.  From office please do the following: #1 order CT of the abdomen and pelvis with contrast regarding epigastric tenderness, right upper quadrant and right flank pain  #2 it looks like she needs a BUN and creatinine please arrange  #3 I will call results after CT scan is in, we will probably go ahead with a colonoscopy to follow-up polyps even though a 7 year recall was in with everything that is going on in less the CT leads in a different direction I would anticipate scheduling that after the CT scan and I will let you know  #4 considering buspirone treatment depending upon the results of above

## 2016-11-07 ENCOUNTER — Other Ambulatory Visit: Payer: 59

## 2016-11-08 ENCOUNTER — Inpatient Hospital Stay: Admission: RE | Admit: 2016-11-08 | Payer: 59 | Source: Ambulatory Visit

## 2016-11-09 ENCOUNTER — Telehealth: Payer: Self-pay | Admitting: Internal Medicine

## 2016-11-09 NOTE — Telephone Encounter (Signed)
Left message on machine to call back   Notes recorded by Marlon Pel, RN on 10/31/2016 at 4:05 PM EDT CT scan scheduled at [facility] on 11/08/16 1:30. She does not need BUN and Creat  She understands to come pick up instructions.

## 2016-11-09 NOTE — Telephone Encounter (Signed)
The pt was re instructed and will pick up contrast a few days prior to CT.  She was advised she does not need labs prior

## 2016-11-18 ENCOUNTER — Ambulatory Visit (INDEPENDENT_AMBULATORY_CARE_PROVIDER_SITE_OTHER)
Admission: RE | Admit: 2016-11-18 | Discharge: 2016-11-18 | Disposition: A | Discharge status: Home / Self Care | Payer: 59 | Source: Ambulatory Visit | Attending: Internal Medicine | Admitting: Internal Medicine

## 2016-11-18 DIAGNOSIS — R10A1 Flank pain, right side: Secondary | ICD-10-CM

## 2016-11-18 DIAGNOSIS — R1011 Right upper quadrant pain: Secondary | ICD-10-CM | POA: Diagnosis not present

## 2016-11-18 DIAGNOSIS — R109 Unspecified abdominal pain: Secondary | ICD-10-CM | POA: Diagnosis not present

## 2016-11-18 DIAGNOSIS — R1013 Epigastric pain: Secondary | ICD-10-CM

## 2016-11-18 DIAGNOSIS — R10811 Right upper quadrant abdominal tenderness: Secondary | ICD-10-CM | POA: Diagnosis not present

## 2016-11-18 MED ORDER — IOPAMIDOL (ISOVUE-300) INJECTION 61%
100.0000 mL | Freq: Once | INTRAVENOUS | Status: AC | PRN
  Administered 2016-11-18: 100 mL via INTRAVENOUS

## 2016-11-21 NOTE — Progress Notes (Signed)
This looks ok Let's go ahead with colonoscopy - hx polyps  I also recommend she try buspirone 10 mg bid # 60 2 RF - we had discussed I think - helps with early satiety and dyspepsia sxs

## 2016-11-22 ENCOUNTER — Other Ambulatory Visit: Payer: Self-pay

## 2016-11-22 MED ORDER — BUSPIRONE HCL 10 MG PO TABS: 10.0000 mg | ORAL_TABLET | Freq: Two times a day (BID) | ORAL | 2 refills | Status: DC

## 2016-12-20 DIAGNOSIS — H40023 Open angle with borderline findings, high risk, bilateral: Secondary | ICD-10-CM | POA: Diagnosis not present

## 2016-12-21 DIAGNOSIS — L821 Other seborrheic keratosis: Secondary | ICD-10-CM | POA: Diagnosis not present

## 2016-12-27 ENCOUNTER — Other Ambulatory Visit: Payer: Self-pay

## 2016-12-27 ENCOUNTER — Ambulatory Visit (AMBULATORY_SURGERY_CENTER): Payer: Self-pay

## 2016-12-27 VITALS — Ht 67.0 in | Wt 150.8 lb

## 2016-12-27 DIAGNOSIS — Z86010 Personal history of colonic polyps: Secondary | ICD-10-CM

## 2016-12-29 ENCOUNTER — Encounter: Payer: Self-pay | Admitting: Internal Medicine

## 2016-12-30 DIAGNOSIS — R1031 Right lower quadrant pain: Secondary | ICD-10-CM | POA: Diagnosis not present

## 2017-01-06 ENCOUNTER — Other Ambulatory Visit: Payer: Self-pay

## 2017-01-06 ENCOUNTER — Encounter: Payer: Self-pay | Admitting: Internal Medicine

## 2017-01-06 ENCOUNTER — Ambulatory Visit (AMBULATORY_SURGERY_CENTER): Payer: 59 | Admitting: Internal Medicine

## 2017-01-06 VITALS — BP 126/67 | HR 62 | Temp 97.1°F | Resp 10 | Ht 67.0 in | Wt 150.0 lb

## 2017-01-06 DIAGNOSIS — D123 Benign neoplasm of transverse colon: Secondary | ICD-10-CM | POA: Diagnosis not present

## 2017-01-06 DIAGNOSIS — Z86010 Personal history of colonic polyps: Secondary | ICD-10-CM

## 2017-01-06 MED ORDER — SODIUM CHLORIDE 0.9 % IV SOLN: 500.0000 mL | Freq: Once | INTRAVENOUS | Status: DC

## 2017-01-06 NOTE — Progress Notes (Signed)
Called to room to assist during endoscopic procedure.  Patient ID and intended procedure confirmed with present staff. Received instructions for my participation in the procedure from the performing physician.  

## 2017-01-06 NOTE — Op Note (Signed)
[facility] Patient Name: Margaret Carney Procedure Date: 01/06/2017 1:11 PM MRN: [id number] Endoscopist: Gatha Mayer , MD Age: 56 Referring MD:  Date of Birth: 1960/09/07 Gender: Female Account #: [id number] Procedure:                Colonoscopy Indications:              Surveillance: Personal history of adenomatous                            polyps on last colonoscopy > 5 years ago Medicines:                Propofol per Anesthesia, Monitored Anesthesia Care Procedure:                Pre-Anesthesia Assessment:                           - Prior to the procedure, a History and Physical                            was performed, and patient medications and                            allergies were reviewed. The patient's tolerance of                            previous anesthesia was also reviewed. The risks                            and benefits of the procedure and the sedation                            options and risks were discussed with the patient.                            All questions were answered, and informed consent                            was obtained. Prior Anticoagulants: The patient has                            taken no previous anticoagulant or antiplatelet                            agents. ASA Grade Assessment: II - A patient with                            mild systemic disease. After reviewing the risks                            and benefits, the patient was deemed in                            satisfactory condition to undergo the procedure.  After obtaining informed consent, the colonoscope                            was passed under direct vision. Throughout the                            procedure, the patient's blood pressure, pulse, and                            oxygen saturations were monitored continuously. The                            Colonoscope was introduced through the anus and   advanced to the the cecum, identified by                            appendiceal orifice and ileocecal valve. The                            colonoscopy was performed without difficulty. The                            patient tolerated the procedure well. The quality                            of the bowel preparation was excellent. The bowel                            preparation used was Miralax. The ileocecal valve,                            appendiceal orifice, and rectum were photographed. Scope In: 1:17:13 PM Scope Out: 1:31:34 PM Scope Withdrawal Time: 0 hours 10 minutes 39 seconds  Total Procedure Duration: 0 hours 14 minutes 21 seconds  Findings:                 The perianal and digital rectal examinations were                            normal.                           A diminutive polyp was found in the transverse                            colon. The polyp was sessile. The polyp was removed                            with a cold snare. Resection and retrieval were                            complete. Verification of patient identification                            for the specimen was  done. Estimated blood loss was                            minimal.                           Multiple diverticula were found in the sigmoid                            colon.                           Scattered diverticula were found in the right colon.                           The exam was otherwise without abnormality on                            direct and retroflexion views.                           The terminal ileum appeared normal. Complications:            No immediate complications. Estimated Blood Loss:     Estimated blood loss was minimal. Impression:               - One diminutive polyp in the transverse colon,                            removed with a cold snare. Resected and retrieved.                           - Diverticulosis in the sigmoid colon.                           -  Diverticulosis in the right colon.                           - The examination was otherwise normal on direct                            and retroflexion views.                           - The examined portion of the ileum was normal.                           - Personal history of colonic polyp - adenoma. Recommendation:           - Patient has a contact number available for                            emergencies. The signs and symptoms of potential                            delayed complications were discussed with the  patient. Return to normal activities tomorrow.                            Written discharge instructions were provided to the                            patient.                           - Continue present medications.                           - Repeat colonoscopy is recommended for                            surveillance. The colonoscopy date will be                            determined after pathology results from today's                            exam become available for review.                           - Resume previous diet. Gatha Mayer, MD 01/06/2017 1:40:45 PM This report has been signed electronically.

## 2017-01-06 NOTE — Progress Notes (Signed)
Report to PACU, RN, vss, BBS= Clear.  

## 2017-01-06 NOTE — Patient Instructions (Addendum)
I found and removed one tiny polyp - not causing symptoms. I also saw mild diverticulosis - pouches or pockets. Not causing problems either.  Cannot explain your symptoms by this exam - suspect it is related to alteration in nerves and muscles or function of the gut.  It may be with time this will go away. I do think taking the buspirone could help - even if you just take it a bedtime.  I appreciate the opportunity to care for you. Gatha Mayer, MD, FACG  YOU HAD AN ENDOSCOPIC PROCEDURE TODAY AT [facility]:   Refer to the procedure report that was given to you for any specific questions about what was found during the examination.  If the procedure report does not answer your questions, please call your gastroenterologist to clarify.  If you requested that your care partner not be given the details of your procedure findings, then the procedure report has been included in a sealed envelope for you to review at your convenience later.  YOU SHOULD EXPECT: Some feelings of bloating in the abdomen. Passage of more gas than usual.  Walking can help get rid of the air that was put into your GI tract during the procedure and reduce the bloating. If you had a lower endoscopy (such as a colonoscopy or flexible sigmoidoscopy) you may notice spotting of blood in your stool or on the toilet paper. If you underwent a bowel prep for your procedure, you may not have a normal bowel movement for a few days.  Please Note:  You might notice some irritation and congestion in your nose or some drainage.  This is from the oxygen used during your procedure.  There is no need for concern and it should clear up in a day or so.  SYMPTOMS TO REPORT IMMEDIATELY:   Following lower endoscopy (colonoscopy or flexible sigmoidoscopy):  Excessive amounts of blood in the stool  Significant tenderness or worsening of abdominal pains  Swelling of the abdomen that is new, acute  Fever of 100F or  higher  For urgent or emergent issues, a gastroenterologist can be reached at any hour by calling [phone].   DIET:  We do recommend a small meal at first, but then you may proceed to your regular diet.  Drink plenty of fluids but you should avoid alcoholic beverages for 24 hours.  ACTIVITY:  You should plan to take it easy for the rest of today and you should NOT DRIVE or use heavy machinery until tomorrow (because of the sedation medicines used during the test).    FOLLOW UP: Our staff will call the number listed on your records the next business day following your procedure to check on you and address any questions or concerns that you may have regarding the information given to you following your procedure. If we do not reach you, we will leave a message.  However, if you are feeling well and you are not experiencing any problems, there is no need to return our call.  We will assume that you have returned to your regular daily activities without incident.  If any biopsies were taken you will be contacted by phone or by letter within the next 1-3 weeks.  Please call us at [phone] if you have not heard about the biopsies in 3 weeks.    SIGNATURES/CONFIDENTIALITY: You and/or your care partner have signed paperwork which will be entered into your electronic medical record.  These signatures attest to  the fact that that the information above on your After Visit Summary has been reviewed and is understood.  Full responsibility of the confidentiality of this discharge information lies with you and/or your care-partner.

## 2017-01-06 NOTE — Progress Notes (Signed)
No change in medical or surgical hx since PV per pt 

## 2017-01-10 ENCOUNTER — Encounter: Payer: Self-pay | Admitting: Internal Medicine

## 2017-01-10 DIAGNOSIS — Z86010 Personal history of colonic polyps: Secondary | ICD-10-CM

## 2017-01-10 NOTE — Progress Notes (Signed)
Diminutive adenoma 7 yr recall 2025

## 2017-01-11 ENCOUNTER — Telehealth: Payer: Self-pay | Admitting: *Deleted

## 2017-01-11 ENCOUNTER — Telehealth: Payer: Self-pay

## 2017-01-11 NOTE — Telephone Encounter (Signed)
  Follow up Call-  Call back number 01/06/2017 10/21/2016  Post procedure Call Back phone  # [phone]  Permission to leave phone message Yes Yes  Some recent data might be hidden     Patient questions:  Do you have a fever, pain , or abdominal swelling? No. Pain Score  0 *  Have you tolerated food without any problems? Yes.    Have you been able to return to your normal activities? Yes.    Do you have any questions about your discharge instructions: Diet   No. Medications  No. Follow up visit  No.  Do you have questions or concerns about your Care? No.  Actions: * If pain score is 4 or above: No action needed, pain <4.

## 2017-01-11 NOTE — Telephone Encounter (Signed)
No answer LMOM 

## 2017-02-28 DIAGNOSIS — Z86010 Personal history of colonic polyps: Secondary | ICD-10-CM | POA: Diagnosis not present

## 2017-02-28 DIAGNOSIS — Z8041 Family history of malignant neoplasm of ovary: Secondary | ICD-10-CM | POA: Diagnosis not present

## 2017-02-28 DIAGNOSIS — Z01419 Encounter for gynecological examination (general) (routine) without abnormal findings: Secondary | ICD-10-CM | POA: Diagnosis not present

## 2017-02-28 DIAGNOSIS — Z6822 Body mass index (BMI) 22.0-22.9, adult: Secondary | ICD-10-CM | POA: Diagnosis not present

## 2017-02-28 DIAGNOSIS — Z803 Family history of malignant neoplasm of breast: Secondary | ICD-10-CM | POA: Diagnosis not present

## 2017-04-19 DIAGNOSIS — Z803 Family history of malignant neoplasm of breast: Secondary | ICD-10-CM | POA: Diagnosis not present

## 2017-04-19 DIAGNOSIS — Z1231 Encounter for screening mammogram for malignant neoplasm of breast: Secondary | ICD-10-CM | POA: Diagnosis not present

## 2017-09-12 DIAGNOSIS — Z0000 Encounter for general adult medical examination without abnormal findings: Secondary | ICD-10-CM | POA: Diagnosis not present

## 2017-09-12 DIAGNOSIS — E039 Hypothyroidism, unspecified: Secondary | ICD-10-CM | POA: Diagnosis not present

## 2017-09-12 DIAGNOSIS — M859 Disorder of bone density and structure, unspecified: Secondary | ICD-10-CM | POA: Diagnosis not present

## 2017-09-12 DIAGNOSIS — K589 Irritable bowel syndrome without diarrhea: Secondary | ICD-10-CM | POA: Diagnosis not present

## 2017-09-14 DIAGNOSIS — Z136 Encounter for screening for cardiovascular disorders: Secondary | ICD-10-CM | POA: Diagnosis not present

## 2017-09-14 DIAGNOSIS — R922 Inconclusive mammogram: Secondary | ICD-10-CM | POA: Diagnosis not present

## 2017-09-14 DIAGNOSIS — N6321 Unspecified lump in the left breast, upper outer quadrant: Secondary | ICD-10-CM | POA: Diagnosis not present

## 2017-11-14 DIAGNOSIS — Z23 Encounter for immunization: Secondary | ICD-10-CM | POA: Diagnosis not present

## 2018-01-01 DIAGNOSIS — R1084 Generalized abdominal pain: Secondary | ICD-10-CM | POA: Diagnosis not present

## 2018-01-01 DIAGNOSIS — R82991 Hypocitraturia: Secondary | ICD-10-CM | POA: Diagnosis not present

## 2018-01-08 DIAGNOSIS — N200 Calculus of kidney: Secondary | ICD-10-CM | POA: Diagnosis not present

## 2018-02-03 DIAGNOSIS — L03031 Cellulitis of right toe: Secondary | ICD-10-CM | POA: Diagnosis not present

## 2018-02-03 DIAGNOSIS — L600 Ingrowing nail: Secondary | ICD-10-CM | POA: Diagnosis not present

## 2018-02-08 DIAGNOSIS — L600 Ingrowing nail: Secondary | ICD-10-CM | POA: Diagnosis not present

## 2018-02-08 DIAGNOSIS — M778 Other enthesopathies, not elsewhere classified: Secondary | ICD-10-CM | POA: Diagnosis not present

## 2018-02-28 DIAGNOSIS — L738 Other specified follicular disorders: Secondary | ICD-10-CM | POA: Diagnosis not present

## 2018-02-28 DIAGNOSIS — L570 Actinic keratosis: Secondary | ICD-10-CM | POA: Diagnosis not present

## 2018-02-28 DIAGNOSIS — D2261 Melanocytic nevi of right upper limb, including shoulder: Secondary | ICD-10-CM | POA: Diagnosis not present

## 2018-02-28 DIAGNOSIS — L821 Other seborrheic keratosis: Secondary | ICD-10-CM | POA: Diagnosis not present

## 2018-02-28 DIAGNOSIS — L918 Other hypertrophic disorders of the skin: Secondary | ICD-10-CM | POA: Diagnosis not present

## 2018-03-13 DIAGNOSIS — R079 Chest pain, unspecified: Secondary | ICD-10-CM | POA: Diagnosis not present

## 2018-03-20 DIAGNOSIS — E7800 Pure hypercholesterolemia, unspecified: Secondary | ICD-10-CM | POA: Diagnosis not present

## 2018-03-20 DIAGNOSIS — E559 Vitamin D deficiency, unspecified: Secondary | ICD-10-CM | POA: Diagnosis not present

## 2018-03-26 DIAGNOSIS — M542 Cervicalgia: Secondary | ICD-10-CM | POA: Diagnosis not present

## 2018-03-26 DIAGNOSIS — S199XXA Unspecified injury of neck, initial encounter: Secondary | ICD-10-CM | POA: Diagnosis not present

## 2018-03-28 DIAGNOSIS — M542 Cervicalgia: Secondary | ICD-10-CM | POA: Diagnosis not present

## 2018-03-30 DIAGNOSIS — S169XXA Unspecified injury of muscle, fascia and tendon at neck level, initial encounter: Secondary | ICD-10-CM | POA: Diagnosis not present

## 2018-03-30 DIAGNOSIS — M542 Cervicalgia: Secondary | ICD-10-CM | POA: Diagnosis not present

## 2018-03-30 DIAGNOSIS — S161XXA Strain of muscle, fascia and tendon at neck level, initial encounter: Secondary | ICD-10-CM | POA: Diagnosis not present

## 2018-04-02 DIAGNOSIS — S169XXA Unspecified injury of muscle, fascia and tendon at neck level, initial encounter: Secondary | ICD-10-CM | POA: Diagnosis not present

## 2018-04-02 DIAGNOSIS — S161XXA Strain of muscle, fascia and tendon at neck level, initial encounter: Secondary | ICD-10-CM | POA: Diagnosis not present

## 2018-04-02 DIAGNOSIS — M542 Cervicalgia: Secondary | ICD-10-CM | POA: Diagnosis not present

## 2018-04-03 DIAGNOSIS — M542 Cervicalgia: Secondary | ICD-10-CM | POA: Diagnosis not present

## 2018-04-03 DIAGNOSIS — S169XXA Unspecified injury of muscle, fascia and tendon at neck level, initial encounter: Secondary | ICD-10-CM | POA: Diagnosis not present

## 2018-04-03 DIAGNOSIS — S161XXA Strain of muscle, fascia and tendon at neck level, initial encounter: Secondary | ICD-10-CM | POA: Diagnosis not present

## 2018-04-11 DIAGNOSIS — S161XXA Strain of muscle, fascia and tendon at neck level, initial encounter: Secondary | ICD-10-CM | POA: Diagnosis not present

## 2018-04-11 DIAGNOSIS — S169XXA Unspecified injury of muscle, fascia and tendon at neck level, initial encounter: Secondary | ICD-10-CM | POA: Diagnosis not present

## 2018-04-11 DIAGNOSIS — M542 Cervicalgia: Secondary | ICD-10-CM | POA: Diagnosis not present

## 2018-04-13 DIAGNOSIS — M542 Cervicalgia: Secondary | ICD-10-CM | POA: Diagnosis not present

## 2018-04-13 DIAGNOSIS — S169XXA Unspecified injury of muscle, fascia and tendon at neck level, initial encounter: Secondary | ICD-10-CM | POA: Diagnosis not present

## 2018-04-13 DIAGNOSIS — S161XXA Strain of muscle, fascia and tendon at neck level, initial encounter: Secondary | ICD-10-CM | POA: Diagnosis not present

## 2018-05-17 DIAGNOSIS — B351 Tinea unguium: Secondary | ICD-10-CM | POA: Diagnosis not present

## 2018-05-17 DIAGNOSIS — L600 Ingrowing nail: Secondary | ICD-10-CM | POA: Diagnosis not present

## 2018-10-25 ENCOUNTER — Other Ambulatory Visit: Payer: Self-pay | Admitting: Family Medicine

## 2018-10-25 DIAGNOSIS — N644 Mastodynia: Secondary | ICD-10-CM

## 2018-11-15 ENCOUNTER — Ambulatory Visit
Admission: RE | Admit: 2018-11-15 | Discharge: 2018-11-15 | Disposition: A | Discharge status: Home / Self Care | Payer: 59 | Source: Ambulatory Visit | Attending: Family Medicine | Admitting: Family Medicine

## 2018-11-15 ENCOUNTER — Other Ambulatory Visit: Payer: Self-pay

## 2018-11-15 DIAGNOSIS — N644 Mastodynia: Secondary | ICD-10-CM

## 2018-11-15 MED ORDER — GADOBENATE DIMEGLUMINE 529 MG/ML IV SOLN
13.0000 mL | Freq: Once | INTRAVENOUS | Status: AC | PRN
  Administered 2018-11-15: 13 mL via INTRAVENOUS

## 2018-12-09 ENCOUNTER — Other Ambulatory Visit: Payer: Self-pay

## 2018-12-09 ENCOUNTER — Encounter (HOSPITAL_BASED_OUTPATIENT_CLINIC_OR_DEPARTMENT_OTHER): Payer: Self-pay | Admitting: Emergency Medicine

## 2018-12-09 ENCOUNTER — Emergency Department (HOSPITAL_BASED_OUTPATIENT_CLINIC_OR_DEPARTMENT_OTHER): Payer: 59

## 2018-12-09 ENCOUNTER — Emergency Department (HOSPITAL_BASED_OUTPATIENT_CLINIC_OR_DEPARTMENT_OTHER)
Admission: EM | Admit: 2018-12-09 | Discharge: 2018-12-09 | Disposition: A | Discharge status: Home / Self Care | Payer: 59 | Attending: Emergency Medicine | Admitting: Emergency Medicine

## 2018-12-09 DIAGNOSIS — R002 Palpitations: Secondary | ICD-10-CM | POA: Diagnosis not present

## 2018-12-09 DIAGNOSIS — Z79899 Other long term (current) drug therapy: Secondary | ICD-10-CM | POA: Insufficient documentation

## 2018-12-09 HISTORY — DX: Anxiety disorder, unspecified: F41.9

## 2018-12-09 LAB — CBC
HCT: 50.4 % — ABNORMAL HIGH (ref 36.0–46.0)
Hemoglobin: 17.1 g/dL — ABNORMAL HIGH (ref 12.0–15.0)
MCH: 31.9 pg (ref 26.0–34.0)
MCHC: 33.9 g/dL (ref 30.0–36.0)
MCV: 94 fL (ref 80.0–100.0)
Platelets: 258 10*3/uL (ref 150–400)
RBC: 5.36 MIL/uL — ABNORMAL HIGH (ref 3.87–5.11)
RDW: 13 % (ref 11.5–15.5)
WBC: 6.2 10*3/uL (ref 4.0–10.5)
nRBC: 0 % (ref 0.0–0.2)

## 2018-12-09 LAB — TROPONIN I (HIGH SENSITIVITY)
Troponin I (High Sensitivity): 4 ng/L (ref <18)
Troponin I (High Sensitivity): 10 ng/L (ref <18)

## 2018-12-09 LAB — BASIC METABOLIC PANEL WITH GFR
Anion gap: 11 (ref 5–15)
BUN: 16 mg/dL (ref 6–20)
CO2: 25 mmol/L (ref 22–32)
Calcium: 9.8 mg/dL (ref 8.9–10.3)
Chloride: 105 mmol/L (ref 98–111)
Creatinine, Ser: 0.88 mg/dL (ref 0.44–1.00)
GFR calc Af Amer: >60 mL/min (ref >60)
GFR calc non Af Amer: >60 mL/min (ref >60)
Glucose, Bld: 94 mg/dL (ref 70–99)
Potassium: 3.2 mmol/L — ABNORMAL LOW (ref 3.5–5.1)
Sodium: 141 mmol/L (ref 135–145)

## 2018-12-09 LAB — CBG MONITORING, ED: Glucose-Capillary: 71 mg/dL (ref 70–99)

## 2018-12-09 MED ORDER — SODIUM CHLORIDE 0.9% FLUSH
3.0000 mL | Freq: Once | INTRAVENOUS | Status: DC
  Filled 2018-12-09: qty 3

## 2018-12-09 NOTE — ED Notes (Signed)
ED Provider at bedside. 

## 2018-12-09 NOTE — ED Provider Notes (Signed)
[facility] EM[facility]ENC[facility] DEPARTMENT Provider Note   CSN: [id number] Arrival date & time: 12/09/18  1537     History   Chief Complaint Chief Complaint  Patient presents with  . Tachycardia    HPI Margaret Carney is a 58 y.o. female.     The history is provided by the patient and medical records. No language interpreter was used.   Margaret Carney is a 58 y.o. female who presents to the Emergency Department complaining of palpitations.  She was driving about 3pm and developed palpitations (racing heart and skipping beats), felt hot with tingling in her hands.  She has nausea during the episodes.  EMS checked an EKG that she was told was ok.  She has had similar episodes in the past starting two years ago.  Today's episode lasted approximated 10 minutes or more.  Currently she feels tired with occasional skipped beats.  She does have frequent headaches.  She has a hx/o benign pituitary tumor.    She saw her PCP about a month ago for similar sxs and has planned referral to Cardiology - does not have appointment yet.    Denies hx/o DVT/PE.  No family hx/o CAD.  Denies tobacco, alcohol, drug use.   Past Medical History:  Diagnosis Date  . Allergy    seasonal  . Anxiety   . Complication of anesthesia   . Cushing's disease ([place])   . Diverticulosis 2013  . GERD (gastroesophageal reflux disease)   . Headache    migraines  . Heart murmur   . Hx of colonic polyps 2013   Tubular Adenoma 2 mm rectal  . Hyperlipidemia   . IBS (irritable bowel syndrome)   . Internal and external hemorrhoids without complication 0000000  . Kidney stone on right side   . Migraines   . MVP (mitral valve prolapse)   . Osteopenia   . PONV (postoperative nausea and vomiting)    needs Scoploamine patch and meds. to prevent nausea- had surgery 05/30/2014 and was given these  . Proctalgia fugax     Patient Active Problem List   Diagnosis Date Noted  . 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 Hemorrhoids,  internal, with bleeding 11/29/2013  . Chronic migraine 04/03/2013  . Personal history of colonic polyps-rectal adenoma 05/26/2011    Past Surgical History:  Procedure Laterality Date  . BREAST SURGERY     right breast biopsy-benign  . COLONOSCOPY W/ BIOPSIES  2013  . CYSTOSCOPY Left 05/30/2014   Procedure: CYSTOSCOPY/LEFT RETROGRADE PYELOGRAM/PLACEMENT URETERAL STENT;  Surgeon: Alexis Frock, MD;  Location: [facility];  Service: Urology;  Laterality: Left;  . CYSTOSCOPY/URETEROSCOPY/HOLMIUM LASER/STENT PLACEMENT Left 06/24/2014   Procedure: CYSTOSCOPY, BILATERAL URETEROSCOPY, BILATERAL STENT PLACEMENT;  Surgeon: Festus Aloe, MD;  Location: [facility];  Service: Urology;  Laterality: Left;  . ESOPHAGOGASTRODUODENOSCOPY    . HOLMIUM LASER APPLICATION Left [id number]   Procedure: HOLMIUM LASER LITHOTRIPSY ;  Surgeon: Festus Aloe, MD;  Location: [facility];  Service: Urology;  Laterality: Left;  . LAPAROSCOPIC ENDOMETRIOSIS FULGURATION  1989  . LITHOTRIPSY     Nephrolithiasis  . pitituary tumor  1989  . TONSILLECTOMY AND ADENOIDECTOMY  1968     OB History   No obstetric history on file.      Home Medications    Prior to Admission medications   Medication Sig Start Date End Date Taking? Authorizing Provider  aspirin-acetaminophen-caffeine (EXCEDRIN MIGRAINE) [id number] MG per tablet Take 1 tablet by mouth every 8 (eight) hours as needed for  headache.    [provider]  atorvastatin (LIPITOR) 20 MG tablet TK 1 T PO QD 11/07/18   [provider]  busPIRone (BUSPAR) 10 MG tablet Take 1 tablet (10 mg total) by mouth 2 (two) times daily. Patient not taking: Reported on 12/27/2016 11/22/16   Gatha Mayer, MD  Calcium-Vitamin D-Vitamin K (CALCIUM SOFT CHEWS PO) Take 1 each by mouth at bedtime.     [provider]  hydrocortisone (ANUSOL-HC) 25 MG suppository Place 1 suppository (25 mg total) rectally daily as needed for hemorrhoids or itching. 04/28/16   Gatha Mayer, MD  ibuprofen (ADVIL,MOTRIN) 600 MG tablet Take 1 tablet (600 mg total) by mouth every 6 (six) hours as needed. 04/21/16   Charlesetta Shanks, MD  Multiple Vitamin (MULTIVITAMIN WITH MINERALS) TABS tablet Take 1 tablet by mouth at bedtime.    [provider]  ondansetron (ZOFRAN ODT) 4 MG disintegrating tablet Take 1 tablet (4 mg total) by mouth every 4 (four) hours as needed for nausea or vomiting. 04/21/16   Charlesetta Shanks, MD  Polyethyl Glycol-Propyl Glycol (SYSTANE OP) Apply 1-2 drops to eye daily as needed (dry eyes.).    [provider]  senna-docusate (SENOKOT S) 8.6-50 MG per tablet Take 1 tablet by mouth 2 (two) times daily. While taking pain meds to prevent constipation. Patient taking differently: Take 1 tablet by mouth 2 (two) times daily as needed for mild constipation.  05/31/14   Alexis Frock, MD  SUMAtriptan (IMITREX) 50 MG tablet Take one tablet at headache onset, if no improvement, may repeat in 2 hours if headache persists or recurs. 04/03/13   Alda Berthold, DO    Family History Family History  Problem Relation Age of Onset  . Colon polyps Mother   . Hypertension Mother        Living, 86  . Dementia Mother   . Breast cancer Sister   . Diabetes Father        Living, 22  . Colon cancer Neg Hx   . Stomach cancer Neg Hx   . Ulcerative colitis Neg Hx   . Esophageal cancer Neg Hx   . Rectal cancer Neg Hx     Social History Social History   Tobacco Use  . Smoking status: Never Smoker  . Smokeless tobacco: Never Used  Substance Use Topics  . Alcohol use: No  . Drug use: No     Allergies   Codeine   Review of Systems Review of Systems  All other systems reviewed and are negative.    Physical Exam Updated Vital Signs BP 129/79   Pulse 70   Temp 98.8 F (37.1 C)   Resp 15   Ht 5\' 8"  (1.727 m)   Wt 63.5 kg   LMP 09/08/2010   SpO2 99%   BMI 21.29 kg/m   Physical Exam Vitals signs and nursing note reviewed.   Constitutional:      Appearance: She is well-developed.  HENT:     Head: Normocephalic and atraumatic.  Cardiovascular:     Rate and Rhythm: Normal rate and regular rhythm.     Heart sounds: No murmur.  Pulmonary:     Effort: Pulmonary effort is normal. No respiratory distress.     Breath sounds: Normal breath sounds.  Abdominal:     Palpations: Abdomen is soft.     Tenderness: There is no abdominal tenderness. There is no guarding or rebound.  Musculoskeletal:  General: No tenderness.  Skin:    General: Skin is warm and dry.  Neurological:     Mental Status: She is alert and oriented to person, place, and time.  Psychiatric:        Behavior: Behavior normal.      ED Treatments / Results  Labs (all labs ordered are listed, but only abnormal results are displayed) Labs Reviewed  BASIC METABOLIC PANEL - Abnormal; Notable for the following components:      Result Value   Potassium 3.2 (*)    All other components within normal limits  CBC - Abnormal; Notable for the following components:   RBC 5.36 (*)    Hemoglobin 17.1 (*)    HCT 50.4 (*)    All other components within normal limits  CBG MONITORING, ED  TROPONIN I (HIGH SENSITIVITY)  TROPONIN I (HIGH SENSITIVITY)    EKG EKG Interpretation  Date/Time:  "Sunday December 09 2018 15:45:09 EST Ventricular Rate:  100 PR Interval:  136 QRS Duration: 78 QT Interval:  332 QTC Calculation: 428 R Axis:   53 Text Interpretation: Normal sinus rhythm Normal ECG Confirmed by Mauricia Mertens ([id number]) on 12/09/2018 3:59:07 PM   Radiology Dg Chest 2 View  Result Date: 12/09/2018 CLINICAL DATA:  58 year-old female was driving today and felt dizzy, nauseous, tachycardia and tingling in her arms. Pt states she has had several episodes like this lately and was getting referred to a cardiologist but hasn't been yet. Hx of MVP EXAM: CHEST - 2 VIEW COMPARISON:  Chest radiograph 05/03/2011 FINDINGS: The heart size and mediastinal  contours are within normal limits. The lungs are clear. No pneumothorax or pleural effusion. The visualized skeletal structures are unremarkable. IMPRESSION: No acute cardiopulmonary findings. Electronically Signed   By: Nancy  Ballantyne M.D.   On: 12/09/2018 16:24    Procedures Procedures (including critical care time)  Medications Ordered in ED Medications - No data to display   Initial Impression / Assessment and Plan / ED Course  I have reviewed the triage vital signs and the nursing notes.  Pertinent labs & imaging results that were available during my care of the patient were reviewed by me and considered in my medical decision making (see chart for details).       Patient here for evaluation of episodic palpitations, symptoms resolved on ED assessment. EKG with sinus rhythm. Electrolytes with mild hypokalemia, otherwise significant abnormalities. Discussed with patient unclear source of symptoms, recommend cardiology follow-up. Home care and return precautions assess. Presentation is not consistent with PE, ACS. Final Clinical Impressions(s) / ED Diagnoses   Final diagnoses:  Palpitations    ED Discharge Orders         Ordered    Ambulatory referral to Cardiology     11" /08/20 1947           Quintella Reichert, MD 12/10/18 0000

## 2018-12-09 NOTE — ED Triage Notes (Signed)
States she was driving when she suddenly felt her heart racing, hot,and dizzy. She was evaluated by EMS. She has had episodes like this previously and is supposed to f/u with cardiology. Denies chest pain.

## 2018-12-11 ENCOUNTER — Ambulatory Visit (INDEPENDENT_AMBULATORY_CARE_PROVIDER_SITE_OTHER): Payer: 59 | Admitting: Cardiology

## 2018-12-11 ENCOUNTER — Other Ambulatory Visit: Payer: Self-pay

## 2018-12-11 ENCOUNTER — Ambulatory Visit (INDEPENDENT_AMBULATORY_CARE_PROVIDER_SITE_OTHER): Payer: 59

## 2018-12-11 ENCOUNTER — Encounter: Payer: Self-pay | Admitting: Cardiology

## 2018-12-11 VITALS — BP 150/92 | HR 81 | Ht 68.0 in | Wt 147.0 lb

## 2018-12-11 DIAGNOSIS — R079 Chest pain, unspecified: Secondary | ICD-10-CM

## 2018-12-11 DIAGNOSIS — I10 Essential (primary) hypertension: Secondary | ICD-10-CM | POA: Insufficient documentation

## 2018-12-11 DIAGNOSIS — I498 Other specified cardiac arrhythmias: Secondary | ICD-10-CM

## 2018-12-11 DIAGNOSIS — Z01812 Encounter for preprocedural laboratory examination: Secondary | ICD-10-CM

## 2018-12-11 DIAGNOSIS — R002 Palpitations: Secondary | ICD-10-CM

## 2018-12-11 HISTORY — DX: Essential (primary) hypertension: I10

## 2018-12-11 HISTORY — DX: Chest pain, unspecified: R07.9

## 2018-12-11 HISTORY — DX: Other specified cardiac arrhythmias: I49.8

## 2018-12-11 HISTORY — DX: Palpitations: R00.2

## 2018-12-11 HISTORY — DX: Encounter for preprocedural laboratory examination: Z01.812

## 2018-12-11 MED ORDER — METOPROLOL SUCCINATE ER 25 MG PO TB24: 12.5000 mg | ORAL_TABLET | Freq: Every day | ORAL | 1 refills | Status: DC

## 2018-12-11 NOTE — Progress Notes (Signed)
Cardiology Office Note:    Date:  12/11/2018   ID:  Margaret Carney, DOB 1960-12-07, MRN [id number]  PCP:  Kelton Pillar, MD  Cardiologist:  Berniece Salines, DO  Electrophysiologist:  None   Referring MD: Kelton Pillar, MD   Chief Complaint  Patient presents with  . Chest Pain    History of Present Illness:    Margaret Carney is a 58 y.o. female with a hx of anxiety, hyperlipidemia, Mitral valve prolapse diagnosis in the past, presents to be evaluated for chest pain and palpitations.   The patient tells me that she has been experiencing intermittent chest pain for over a year. She notes that most recent experience was about a month or 2 ago. She describes it as a left sided chest pressure/heaviness which radiates to her neck and jaw. She states that sometimes it goes to her left shoulder. She admits to associated shortness of breath and palpitations.   She also doer mentions that she gets isolated palpitations going back three years. The most recent and remarkable experienced of this was on Sunday. She states that she was driving home on Sunday when she started to experience an abrupt onset of rapid heart rate. She states the it felt like her heart was pounding as if it was going to jump off her chest. She admits to associated lightheadedness and dizziness. She ended up driving to a parking lot where someone helped call 911. EMS did come and did take an initial EKG which rule out a "heart attack". She then was taken to the ED by her sister. In the ED her work up was unremarkable and patient was asked to see cardiology in follow up.   Today she is not experiencing any chest pain but did have some palpitations prior to her visit.   Past Medical History:  Diagnosis Date  . Allergy    seasonal  . Anxiety   . Complication of anesthesia   . Cushing's disease ([place])   . Diverticulosis 2013  . GERD (gastroesophageal reflux disease)   . Headache    migraines  . Heart murmur   . Hx of  colonic polyps 2013   Tubular Adenoma 2 mm rectal  . Hyperlipidemia   . IBS (irritable bowel syndrome)   . Internal and external hemorrhoids without complication 0000000  . Kidney stone on right side   . Migraines   . MVP (mitral valve prolapse)   . Osteopenia   . PONV (postoperative nausea and vomiting)    needs Scoploamine patch and meds. to prevent nausea- had surgery 05/30/2014 and was given these  . Proctalgia fugax     Past Surgical History:  Procedure Laterality Date  . BREAST SURGERY     right breast biopsy-benign  . COLONOSCOPY W/ BIOPSIES  2013  . CYSTOSCOPY Left 05/30/2014   Procedure: CYSTOSCOPY/LEFT RETROGRADE PYELOGRAM/PLACEMENT URETERAL STENT;  Surgeon: Alexis Frock, MD;  Location: [facility];  Service: Urology;  Laterality: Left;  . CYSTOSCOPY/URETEROSCOPY/HOLMIUM LASER/STENT PLACEMENT Left 06/24/2014   Procedure: CYSTOSCOPY, BILATERAL URETEROSCOPY, BILATERAL STENT PLACEMENT;  Surgeon: Festus Aloe, MD;  Location: [facility];  Service: Urology;  Laterality: Left;  . ESOPHAGOGASTRODUODENOSCOPY    . HOLMIUM LASER APPLICATION Left [id number]   Procedure: HOLMIUM LASER LITHOTRIPSY ;  Surgeon: Festus Aloe, MD;  Location: [facility];  Service: Urology;  Laterality: Left;  . LAPAROSCOPIC ENDOMETRIOSIS FULGURATION  1989  . LITHOTRIPSY     Nephrolithiasis  . pitituary tumor  1989  . TONSILLECTOMY AND ADENOIDECTOMY  1968    Current Medications: Current Meds  Medication Sig  . aspirin-acetaminophen-caffeine (EXCEDRIN MIGRAINE) 250-250-65 MG per tablet Take 1 tablet by mouth every 8 (eight) hours as needed for headache.  Marland Kitchen atorvastatin (LIPITOR) 20 MG tablet TK 1 T PO QD  . Calcium-Vitamin D-Vitamin K (CALCIUM SOFT CHEWS PO) Take 1 each by mouth at bedtime.   . hydrocortisone (ANUSOL-HC) 25 MG suppository Place 1 suppository (25 mg total) rectally daily as needed for hemorrhoids or itching.  . Multiple Vitamin (MULTIVITAMIN WITH MINERALS) TABS tablet Take 1 tablet by mouth at  bedtime.  Marland Kitchen omeprazole (PRILOSEC) 40 MG capsule omeprazole 40 mg capsule,delayed release  TK ONE C PO QD  . ondansetron (ZOFRAN ODT) 4 MG disintegrating tablet Take 1 tablet (4 mg total) by mouth every 4 (four) hours as needed for nausea or vomiting.  Vladimir Faster Glycol-Propyl Glycol (SYSTANE OP) Apply 1-2 drops to eye daily as needed (dry eyes.).  Marland Kitchen senna-docusate (SENOKOT S) 8.6-50 MG per tablet Take 1 tablet by mouth 2 (two) times daily. While taking pain meds to prevent constipation. (Patient taking differently: Take 1 tablet by mouth 2 (two) times daily as needed for mild constipation. )  . SUMAtriptan (IMITREX) 50 MG tablet Take one tablet at headache onset, if no improvement, may repeat in 2 hours if headache persists or recurs.  . [DISCONTINUED] ibuprofen (ADVIL,MOTRIN) 600 MG tablet Take 1 tablet (600 mg total) by mouth every 6 (six) hours as needed.   Current Facility-Administered Medications for the 12/11/18 encounter (Office Visit) with Berniece Salines, DO  Medication  . 0.9 %  sodium chloride infusion     Allergies:   Codeine   Social History   Socioeconomic History  . Marital status: Widowed    Spouse name: Not on file  . Number of children: 1  . Years of education: Not on file  . Highest education level: Not on file  Occupational History  . Occupation: Herbalist: [place]  . Financial resource strain: Not on file  . Food insecurity    Worry: Not on file    Inability: Not on file  . Transportation needs    Medical: Not on file    Non-medical: Not on file  Tobacco Use  . Smoking status: Never Smoker  . Smokeless tobacco: Never Used  Substance and Sexual Activity  . Alcohol use: No  . Drug use: No  . Sexual activity: Never  Lifestyle  . Physical activity    Days per week: Not on file    Minutes per session: Not on file  . Stress: Not on file  Relationships  . Social Herbalist on phone: Not on file    Gets  together: Not on file    Attends religious service: Not on file    Active member of club or organization: Not on file    Attends meetings of clubs or organizations: Not on file    Relationship status: Not on file  Other Topics Concern  . Not on file  Social History Narrative   Widowed 2018  one son.   She works as a Government social research officer.   Highest level of education:  Two years of college     Family History: The patient's family history includes Breast cancer in her sister; Colon polyps in her mother; Dementia in her mother; Diabetes in her father; Heart disease in her paternal grandmother; Hypertension in her mother. There is  no history of Colon cancer, Stomach cancer, Ulcerative colitis, Esophageal cancer, or Rectal cancer.  ROS:   Review of Systems  Constitution: Negative for decreased appetite, fever and weight gain.  HENT: Negative for congestion, ear discharge, hoarse voice and sore throat.   Eyes: Negative for discharge, redness, vision loss in right eye and visual halos.  Cardiovascular: reports chest pain, palpitations. Negative for dyspnea on exertion, leg swelling, orthopnea. Respiratory: Negative for cough, hemoptysis, shortness of breath and snoring.   Endocrine: Negative for heat intolerance and polyphagia.  Hematologic/Lymphatic: Negative for bleeding problem. Does not bruise/bleed easily.  Skin: Negative for flushing, nail changes, rash and suspicious lesions.  Musculoskeletal: Negative for arthritis, joint pain, muscle cramps, myalgias, neck pain and stiffness.  Gastrointestinal: Negative for abdominal pain, bowel incontinence, diarrhea and excessive appetite.  Genitourinary: Negative for decreased libido, genital sores and incomplete emptying.  Neurological: Negative for brief paralysis, focal weakness, headaches and loss of balance.  Psychiatric/Behavioral: Negative for altered mental status, depression and suicidal ideas.  Allergic/Immunologic: Negative for HIV exposure  and persistent infections.    EKGs/Labs/Other Studies Reviewed:    The following studies were reviewed today:   EKG:  The ekg ordered today demonstrates sinus rhythm , with HR 77 bpm with arrhythmia, compared to 12/09/2018 the patient is no longer in sinus tachycardia and the sinus arrhythmia is new.  Recent Labs: 12/09/2018: BUN 16; Creatinine, Ser 0.88; Hemoglobin 17.1; Platelets 258; Potassium 3.2; Sodium 141  Recent Lipid Panel    Component Value Date/Time   CHOL (H) 09/23/2008 0905    205        ATP III CLASSIFICATION:  <200     mg/dL   Desirable  200-239  mg/dL   Borderline High  >=240    mg/dL   High          TRIG 35 09/23/2008 0905   HDL 60 09/23/2008 0905   CHOLHDL 3.4 09/23/2008 0905   VLDL 7 09/23/2008 0905   LDLCALC (H) 09/23/2008 0905    138        Total Cholesterol/HDL:CHD Risk Coronary Heart Disease Risk Table                     Men   Women  1/2 Average Risk   3.4   3.3  Average Risk       5.0   4.4  2 X Average Risk   9.6   7.1  3 X Average Risk  23.4   11.0        Use the calculated Patient Ratio above and the CHD Risk Table to determine the patient's CHD Risk.        ATP III CLASSIFICATION (LDL):  <100     mg/dL   Optimal  100-129  mg/dL   Near or Above                    Optimal  130-159  mg/dL   Borderline  160-189  mg/dL   High  >190     mg/dL   Very High    Physical Exam:    VS:  BP (!) 150/92 (BP Location: Left Arm, Patient Position: Sitting, Cuff Size: Normal)   Pulse 81   Ht 5\' 8"  (1.727 m)   Wt 147 lb (66.7 kg)   LMP 09/08/2010   SpO2 100%   BMI 22.35 kg/m     Wt Readings from Last 3 Encounters:  12/11/18 147 lb (66.7 kg)  12/09/18 140 lb (63.5 kg)  01/06/17 150 lb (68 kg)     GEN: Well nourished, well developed in no acute distress HEENT: Normal NECK: No JVD; No carotid bruits LYMPHATICS: No lymphadenopathy CARDIAC: S1S2 noted,RRR, no murmurs, rubs, gallops RESPIRATORY:  Clear to auscultation without rales, wheezing or  rhonchi  ABDOMEN: Soft, non-tender, non-distended, +bowel sounds, no guarding. EXTREMITIES: No edema, No cyanosis, no clubbing MUSCULOSKELETAL:  No edema; No deformity  SKIN: Warm and dry NEUROLOGIC:  Alert and oriented x 3, non-focal PSYCHIATRIC:  Normal affect, good insight  ASSESSMENT:    1. Pre-procedure lab exam   2. Chest pain of uncertain etiology   3. Palpitations   4. Hypertension, unspecified type   5. Sinus arrhythmia    PLAN:    1. Her chest pain is concerning, with her risk factors I will like to pursue an ischemic evaluation in this patient. A CTA coronaries will be appropriate. I educated the patient about this testing, she denies any contrast allergies. She is agreeable to proceed with this testing.   2. I would like to rule out a cardiovascular etiology of this palpitation, therefore at this time I would like to placed a zio patch for  7 days. In additon a transthoracic echocardiogram will be ordered to assess LV/RV function and any structural abnormalities. Once these testing have been performed amd reviewed further reccomendations will be made. For now, I do reccomend that the patient goes to the nearest ED if  symptoms recur.   3. Hypertension - I am starting the patient on Toprol Xl 12.5mg  daily.I am hoping medication also help with the symptomatic control of her palpitations. I educated the patient on the side effects of this medication.   4. There is evidence of sinus arrhythmia on her EKG. The patient admits to heavy caffeine use. I have asked her to cut back on her caffeine use and hopefully this will help.  5. Hyperlipidemia - she has been started on Lipitor 20mg  daily. We will repeat her lipid profile at her follow up visit  The patient is in agreement with the above plan. The patient left the office in stable condition.  The patient will follow up in 3 months.   Medication Adjustments/Labs and Tests Ordered: Current medicines are reviewed at length with the  patient today.  Concerns regarding medicines are outlined above.  Orders Placed This Encounter  Procedures  . CT CORONARY FRACTIONAL FLOW RESERVE DATA PREP  . CT CORONARY FRACTIONAL FLOW RESERVE FLUID ANALYSIS  . CT CORONARY MORPH W/CTA COR W/SCORE W/CA W/CM &/OR WO/CM  . Basic Metabolic Panel (BMET)  . LONG TERM MONITOR (3-14 DAYS)  . EKG 12-Lead  . ECHOCARDIOGRAM COMPLETE   Meds ordered this encounter  Medications  . metoprolol succinate (TOPROL XL) 25 MG 24 hr tablet    Sig: Take 0.5 tablets (12.5 mg total) by mouth daily.    Dispense:  45 tablet    Refill:  1    Patient Instructions  Medication Instructions:  Your physician has recommended you make the following change in your medication:    START: Toprol XL(metoprolol XL)  25 mg Take 1/2 tab daily  *If you need a refill on your cardiac medications before your next appointment, please call your pharmacy*  Lab Work: Your physician recommends that you return for lab work in:   3-7 days prior to CT: BMP  If you have labs (blood work) drawn today and your tests are completely normal, you will receive  your results only by: Marland Kitchen MyChart Message (if you have MyChart) OR . A paper copy in the mail If you have any lab test that is abnormal or we need to change your treatment, we will call you to review the results.  Testing/Procedures: Your physician has requested that you have an echocardiogram. Echocardiography is a painless test that uses sound waves to create images of your heart. It provides your doctor with information about the size and shape of your heart and how well your heart's chambers and valves are working. This procedure takes approximately one hour. There are no restrictions for this procedure.  A zio monitor was placed today. It will remain on for 7 days. You will then return monitor and event diary in provided box. It takes 1-2 weeks for report to be downloaded and returned to [place]. We will call you with the results.  If monitor falls off or has orange flashing light, please call Zio for further instructions.   Your physician has requested that you have cardiac CT. Cardiac computed tomography (CT) is a painless test that uses an x-ray machine to take clear, detailed pictures of your heart. For further information please visit [email or web address]tn. Please follow instruction sheet as given.  Your cardiac CT will be scheduled at one of the below locations:   [facility] [address] [place], [place] [address] [phone]  If scheduled at [facility], please arrive at the [facility] main entrance of [facility] 30-45 minutes prior to test start time. Proceed to the [facility] Radiology Department (first floor) to check-in and test prep.  Please follow these instructions carefully (unless otherwise directed):  On the Night Before the Test: . Be sure to Drink plenty of water. . Do not consume any caffeinated/decaffeinated beverages or chocolate 12 hours prior to your test. . Do not take any antihistamines 12 hours prior to your test.  On the Day of the Test: . Drink plenty of water. Do not drink any water within one hour of the test. . Do not eat any food 4 hours prior to the test. . You may take your regular medications prior to the test.  . Take metoprolol (Toprol XL) two hours prior to test. . FEMALES- please wear underwire-free bra if available  After the Test: . Drink plenty of water. . After receiving IV contrast, you may experience a mild flushed feeling. This is normal. . On occasion, you may experience a mild rash up to 24 hours after the test. This is not dangerous. If this occurs, you can take Benadryl 25 mg and increase your fluid intake. . If you experience trouble breathing, this can be serious. If it is severe call 911 IMMEDIATELY. If it is mild, please call our office.  Once we have confirmed authorization from your insurance company, we will call you to  set up a date and time for your test.   For non-scheduling related questions, please contact the cardiac imaging nurse navigator should you have any questions/concerns: Marchia Bond, RN Navigator Cardiac Imaging [facility] [phone] Office     Follow-Up: At [facility], you and your health needs are our priority.  As part of our continuing mission to provide you with exceptional heart care, we have created designated Provider Care Teams.  These Care Teams include your primary Cardiologist (physician) and Advanced Practice Providers (APPs -  Physician Assistants and Nurse Practitioners) who all work together to provide you with  the care you need, when you need it.  Your next appointment:   3 months  The format for your next appointment:   In Person  Provider:   Berniece Salines, DO  Other Instructions  Echocardiogram An echocardiogram is a procedure that uses painless sound waves (ultrasound) to produce an image of the heart. Images from an echocardiogram can provide important information about:  Signs of coronary artery disease (CAD).  Aneurysm detection. An aneurysm is a weak or damaged part of an artery wall that bulges out from the normal force of blood pumping through the body.  Heart size and shape. Changes in the size or shape of the heart can be associated with certain conditions, including heart failure, aneurysm, and CAD.  Heart muscle function.  Heart valve function.  Signs of a past heart attack.  Fluid buildup around the heart.  Thickening of the heart muscle.  A tumor or infectious growth around the heart valves. Tell a health care provider about:  Any allergies you have.  All medicines you are taking, including vitamins, herbs, eye drops, creams, and over-the-counter medicines.  Any blood disorders you have.  Any surgeries you have had.  Any medical conditions you have.  Whether you are pregnant or may be pregnant.  What are the risks? Generally, this is a safe procedure. However, problems may occur, including:  Allergic reaction to dye (contrast) that may be used during the procedure. What happens before the procedure? No specific preparation is needed. You may eat and drink normally. What happens during the procedure?   An IV tube may be inserted into one of your veins.  You may receive contrast through this tube. A contrast is an injection that improves the quality of the pictures from your heart.  A gel will be applied to your chest.  A wand-like tool (transducer) will be moved over your chest. The gel will help to transmit the sound waves from the transducer.  The sound waves will harmlessly bounce off of your heart to allow the heart images to be captured in real-time motion. The images will be recorded on a computer. The procedure may vary among health care providers and hospitals. What happens after the procedure?  You may return to your normal, everyday life, including diet, activities, and medicines, unless your health care provider tells you not to do that. Summary  An echocardiogram is a procedure that uses painless sound waves (ultrasound) to produce an image of the heart.  Images from an echocardiogram can provide important information about the size and shape of your heart, heart muscle function, heart valve function, and fluid buildup around your heart.  You do not need to do anything to prepare before this procedure. You may eat and drink normally.  After the echocardiogram is completed, you may return to your normal, everyday life, unless your health care provider tells you not to do that. This information is not intended to replace advice given to you by your health care provider. Make sure you discuss any questions you have with your health care provider. Document Released: 01/15/2000 Document Revised: 05/10/2018 Document Reviewed: 02/20/2016 Elsevier Patient Education   2020 [employer].  Cardiac CT Angiogram  A cardiac CT angiogram is a procedure to look at the heart and the area around the heart. It may be done to help find the cause of chest pains or other symptoms of heart disease. During this procedure, a large X-ray machine, called a CT scanner, takes detailed pictures of  the heart and the surrounding area after a dye (contrast material) has been injected into blood vessels in the area. The procedure is also sometimes called a coronary CT angiogram, coronary artery scanning, or CTA. A cardiac CT angiogram allows the health care provider to see how well blood is flowing to and from the heart. The health care provider will be able to see if there are any problems, such as:  Blockage or narrowing of the coronary arteries in the heart.  Fluid around the heart.  Signs of weakness or disease in the muscles, valves, and tissues of the heart. Tell a health care provider about:  Any allergies you have. This is especially important if you have had a previous allergic reaction to contrast dye.  All medicines you are taking, including vitamins, herbs, eye drops, creams, and over-the-counter medicines.  Any blood disorders you have.  Any surgeries you have had.  Any medical conditions you have.  Whether you are pregnant or may be pregnant.  Any anxiety disorders, chronic pain, or other conditions you have that may increase your stress or prevent you from lying still. What are the risks? Generally, this is a safe procedure. However, problems may occur, including:  Bleeding.  Infection.  Allergic reactions to medicines or dyes.  Damage to other structures or organs.  Kidney damage from the dye or contrast that is used.  Increased risk of cancer from radiation exposure. This risk is low. Talk with your health care provider about: ? The risks and benefits of testing. ? How you can receive the lowest dose of radiation. What happens before the  procedure?  Wear comfortable clothing and remove any jewelry, glasses, dentures, and hearing aids.  Follow instructions from your health care provider about eating and drinking. This may include: ? For 12 hours before the test - avoid caffeine. This includes tea, coffee, soda, energy drinks, and diet pills. Drink plenty of water or other fluids that do not have caffeine in them. Being well-hydrated can prevent complications. ? For 4-6 hours before the test - stop eating and drinking. The contrast dye can cause nausea, but this is less likely if your stomach is empty.  Ask your health care provider about changing or stopping your regular medicines. This is especially important if you are taking diabetes medicines, blood thinners, or medicines to treat erectile dysfunction. What happens during the procedure?  Hair on your chest may need to be removed so that small sticky patches called electrodes can be placed on your chest. These will transmit information that helps to monitor your heart during the test.  An IV tube will be inserted into one of your veins.  You might be given a medicine to control your heart rate during the test. This will help to ensure that good images are obtained.  You will be asked to lie on an exam table. This table will slide in and out of the CT machine during the procedure.  Contrast dye will be injected into the IV tube. You might feel warm, or you may get a metallic taste in your mouth.  You will be given a medicine (nitroglycerin) to relax (dilate) the arteries in your heart.  The table that you are lying on will move into the CT machine tunnel for the scan.  The person running the machine will give you instructions while the scans are being done. You may be asked to: ? Keep your arms above your head. ? Hold your breath. ? Stay  very still, even if the table is moving.  When the scanning is complete, you will be moved out of the machine.  The IV tube will be  removed. The procedure may vary among health care providers and hospitals. What happens after the procedure?  You might feel warm, or you may get a metallic taste in your mouth from the contrast dye.  You may have a headache from the nitroglycerin.  After the procedure, drink water or other fluids to wash (flush) the contrast material out of your body.  Contact a health care provider if you have any symptoms of allergy to the contrast. These symptoms include: ? Shortness of breath. ? Rash or hives. ? A racing heartbeat.  Most people can return to their normal activities right after the procedure. Ask your health care provider what activities are safe for you.  It is up to you to get the results of your procedure. Ask your health care provider, or the department that is doing the procedure, when your results will be ready. Summary  A cardiac CT angiogram is a procedure to look at the heart and the area around the heart. It may be done to help find the cause of chest pains or other symptoms of heart disease.  During this procedure, a large X-ray machine, called a CT scanner, takes detailed pictures of the heart and the surrounding area after a dye (contrast material) has been injected into blood vessels in the area.  Ask your health care provider about changing or stopping your regular medicines before the procedure. This is especially important if you are taking diabetes medicines, blood thinners, or medicines to treat erectile dysfunction.  After the procedure, drink water or other fluids to wash (flush) the contrast material out of your body. This information is not intended to replace advice given to you by your health care provider. Make sure you discuss any questions you have with your health care provider. Document Released: 12/31/2007 Document Revised: 12/30/2016 Document Reviewed: 12/07/2015 Elsevier Patient Education  2020 [employer].      Adopting a Healthy Lifestyle.   Know what a healthy weight is for you (roughly BMI <25) and aim to maintain this   Aim for 7+ servings of fruits and vegetables daily   65-80+ fluid ounces of water or unsweet tea for healthy kidneys   Limit to max 1 drink of alcohol per day; avoid smoking/tobacco   Limit animal fats in diet for cholesterol and heart health - choose grass fed whenever available   Avoid highly processed foods, and foods high in saturated/trans fats   Aim for low stress - take time to unwind and care for your mental health   Aim for 150 min of moderate intensity exercise weekly for heart health, and weights twice weekly for bone health   Aim for 7-9 hours of sleep daily   When it comes to diets, agreement about the perfect plan isnt easy to find, even among the experts. Experts at the [place] developed an idea known as the Healthy Eating Plate. Just imagine a plate divided into logical, healthy portions.   The emphasis is on diet quality:   Load up on vegetables and fruits - one-half of your plate: Aim for color and variety, and remember that potatoes dont count.   Go for whole grains - one-quarter of your plate: Whole wheat, barley, wheat berries, quinoa, oats, brown rice, and foods made with them. If you want pasta, go  with whole wheat pasta.   Protein power - one-quarter of your plate: Fish, chicken, beans, and nuts are all healthy, versatile protein sources. Limit red meat.   The diet, however, does go beyond the plate, offering a few other suggestions.   Use healthy plant oils, such as olive, canola, soy, corn, sunflower and peanut. Check the labels, and avoid partially hydrogenated oil, which have unhealthy trans fats.   If youre thirsty, drink water. Coffee and tea are good in moderation, but skip sugary drinks and limit milk and dairy products to one or two daily servings.   The type of carbohydrate in the diet is more important than the amount. Some sources of  carbohydrates, such as vegetables, fruits, whole grains, and beans-are healthier than others.   Finally, stay active  Signed, Berniece Salines, DO  12/11/2018 7:42 PM    [facility]

## 2018-12-11 NOTE — Patient Instructions (Signed)
Medication Instructions:  Your physician has recommended you make the following change in your medication:    START: Toprol XL(metoprolol XL)  25 mg Take 1/2 tab daily  *If you need a refill on your cardiac medications before your next appointment, please call your pharmacy*  Lab Work: Your physician recommends that you return for lab work in:   3-7 days prior to CT: BMP  If you have labs (blood work) drawn today and your tests are completely normal, you will receive your results only by: Marland Kitchen MyChart Message (if you have MyChart) OR . A paper copy in the mail If you have any lab test that is abnormal or we need to change your treatment, we will call you to review the results.  Testing/Procedures: Your physician has requested that you have an echocardiogram. Echocardiography is a painless test that uses sound waves to create images of your heart. It provides your doctor with information about the size and shape of your heart and how well your heart's chambers and valves are working. This procedure takes approximately one hour. There are no restrictions for this procedure.  A zio monitor was placed today. It will remain on for 7 days. You will then return monitor and event diary in provided box. It takes 1-2 weeks for report to be downloaded and returned to [place]. We will call you with the results. If monitor falls off or has orange flashing light, please call Zio for further instructions.   Your physician has requested that you have cardiac CT. Cardiac computed tomography (CT) is a painless test that uses an x-ray machine to take clear, detailed pictures of your heart. For further information please visit [email or web address]. Please follow instruction sheet as given.  Your cardiac CT will be scheduled at one of the below locations:   [facility] [address] [place], [place] [address] [phone]  If scheduled at [facility], please arrive at the [facility] main  entrance of [facility] 30-45 minutes prior to test start time. Proceed to the [facility] Radiology Department (first floor) to check-in and test prep.  Please follow these instructions carefully (unless otherwise directed):  On the Night Before the Test: . Be sure to Drink plenty of water. . Do not consume any caffeinated/decaffeinated beverages or chocolate 12 hours prior to your test. . Do not take any antihistamines 12 hours prior to your test.  On the Day of the Test: . Drink plenty of water. Do not drink any water within one hour of the test. . Do not eat any food 4 hours prior to the test. . You may take your regular medications prior to the test.  . Take metoprolol (Toprol XL) two hours prior to test. . FEMALES- please wear underwire-free bra if available  After the Test: . Drink plenty of water. . After receiving IV contrast, you may experience a mild flushed feeling. This is normal. . On occasion, you may experience a mild rash up to 24 hours after the test. This is not dangerous. If this occurs, you can take Benadryl 25 mg and increase your fluid intake. . If you experience trouble breathing, this can be serious. If it is severe call 911 IMMEDIATELY. If it is mild, please call our office.  Once we have confirmed authorization from your insurance company, we will call you to set up a date and time for your test.   For non-scheduling related questions, please contact the cardiac imaging nurse navigator  should you have any questions/concerns: Marchia Bond, RN Navigator Cardiac Imaging [facility] [phone] Office     Follow-Up: At [facility], you and your health needs are our priority.  As part of our continuing mission to provide you with exceptional heart care, we have created designated Provider Care Teams.  These Care Teams include your primary Cardiologist (physician) and Advanced Practice Providers (APPs -  Physician Assistants  and Nurse Practitioners) who all work together to provide you with the care you need, when you need it.  Your next appointment:   3 months  The format for your next appointment:   In Person  Provider:   Berniece Salines, DO  Other Instructions  Echocardiogram An echocardiogram is a procedure that uses painless sound waves (ultrasound) to produce an image of the heart. Images from an echocardiogram can provide important information about:  Signs of coronary artery disease (CAD).  Aneurysm detection. An aneurysm is a weak or damaged part of an artery wall that bulges out from the normal force of blood pumping through the body.  Heart size and shape. Changes in the size or shape of the heart can be associated with certain conditions, including heart failure, aneurysm, and CAD.  Heart muscle function.  Heart valve function.  Signs of a past heart attack.  Fluid buildup around the heart.  Thickening of the heart muscle.  A tumor or infectious growth around the heart valves. Tell a health care provider about:  Any allergies you have.  All medicines you are taking, including vitamins, herbs, eye drops, creams, and over-the-counter medicines.  Any blood disorders you have.  Any surgeries you have had.  Any medical conditions you have.  Whether you are pregnant or may be pregnant. What are the risks? Generally, this is a safe procedure. However, problems may occur, including:  Allergic reaction to dye (contrast) that may be used during the procedure. What happens before the procedure? No specific preparation is needed. You may eat and drink normally. What happens during the procedure?   An IV tube may be inserted into one of your veins.  You may receive contrast through this tube. A contrast is an injection that improves the quality of the pictures from your heart.  A gel will be applied to your chest.  A wand-like tool (transducer) will be moved over your chest. The  gel will help to transmit the sound waves from the transducer.  The sound waves will harmlessly bounce off of your heart to allow the heart images to be captured in real-time motion. The images will be recorded on a computer. The procedure may vary among health care providers and hospitals. What happens after the procedure?  You may return to your normal, everyday life, including diet, activities, and medicines, unless your health care provider tells you not to do that. Summary  An echocardiogram is a procedure that uses painless sound waves (ultrasound) to produce an image of the heart.  Images from an echocardiogram can provide important information about the size and shape of your heart, heart muscle function, heart valve function, and fluid buildup around your heart.  You do not need to do anything to prepare before this procedure. You may eat and drink normally.  After the echocardiogram is completed, you may return to your normal, everyday life, unless your health care provider tells you not to do that. This information is not intended to replace advice given to you by your health care provider.  Make sure you discuss any questions you have with your health care provider. Document Released: 01/15/2000 Document Revised: 05/10/2018 Document Reviewed: 02/20/2016 Elsevier Patient Education  2020 [employer].  Cardiac CT Angiogram  A cardiac CT angiogram is a procedure to look at the heart and the area around the heart. It may be done to help find the cause of chest pains or other symptoms of heart disease. During this procedure, a large X-ray machine, called a CT scanner, takes detailed pictures of the heart and the surrounding area after a dye (contrast material) has been injected into blood vessels in the area. The procedure is also sometimes called a coronary CT angiogram, coronary artery scanning, or CTA. A cardiac CT angiogram allows the health care provider to see how well blood is  flowing to and from the heart. The health care provider will be able to see if there are any problems, such as:  Blockage or narrowing of the coronary arteries in the heart.  Fluid around the heart.  Signs of weakness or disease in the muscles, valves, and tissues of the heart. Tell a health care provider about:  Any allergies you have. This is especially important if you have had a previous allergic reaction to contrast dye.  All medicines you are taking, including vitamins, herbs, eye drops, creams, and over-the-counter medicines.  Any blood disorders you have.  Any surgeries you have had.  Any medical conditions you have.  Whether you are pregnant or may be pregnant.  Any anxiety disorders, chronic pain, or other conditions you have that may increase your stress or prevent you from lying still. What are the risks? Generally, this is a safe procedure. However, problems may occur, including:  Bleeding.  Infection.  Allergic reactions to medicines or dyes.  Damage to other structures or organs.  Kidney damage from the dye or contrast that is used.  Increased risk of cancer from radiation exposure. This risk is low. Talk with your health care provider about: ? The risks and benefits of testing. ? How you can receive the lowest dose of radiation. What happens before the procedure?  Wear comfortable clothing and remove any jewelry, glasses, dentures, and hearing aids.  Follow instructions from your health care provider about eating and drinking. This may include: ? For 12 hours before the test - avoid caffeine. This includes tea, coffee, soda, energy drinks, and diet pills. Drink plenty of water or other fluids that do not have caffeine in them. Being well-hydrated can prevent complications. ? For 4-6 hours before the test - stop eating and drinking. The contrast dye can cause nausea, but this is less likely if your stomach is empty.  Ask your health care provider about  changing or stopping your regular medicines. This is especially important if you are taking diabetes medicines, blood thinners, or medicines to treat erectile dysfunction. What happens during the procedure?  Hair on your chest may need to be removed so that small sticky patches called electrodes can be placed on your chest. These will transmit information that helps to monitor your heart during the test.  An IV tube will be inserted into one of your veins.  You might be given a medicine to control your heart rate during the test. This will help to ensure that good images are obtained.  You will be asked to lie on an exam table. This table will slide in and out of the CT machine during the procedure.  Contrast dye will be injected  into the IV tube. You might feel warm, or you may get a metallic taste in your mouth.  You will be given a medicine (nitroglycerin) to relax (dilate) the arteries in your heart.  The table that you are lying on will move into the CT machine tunnel for the scan.  The person running the machine will give you instructions while the scans are being done. You may be asked to: ? Keep your arms above your head. ? Hold your breath. ? Stay very still, even if the table is moving.  When the scanning is complete, you will be moved out of the machine.  The IV tube will be removed. The procedure may vary among health care providers and hospitals. What happens after the procedure?  You might feel warm, or you may get a metallic taste in your mouth from the contrast dye.  You may have a headache from the nitroglycerin.  After the procedure, drink water or other fluids to wash (flush) the contrast material out of your body.  Contact a health care provider if you have any symptoms of allergy to the contrast. These symptoms include: ? Shortness of breath. ? Rash or hives. ? A racing heartbeat.  Most people can return to their normal activities right after the procedure.  Ask your health care provider what activities are safe for you.  It is up to you to get the results of your procedure. Ask your health care provider, or the department that is doing the procedure, when your results will be ready. Summary  A cardiac CT angiogram is a procedure to look at the heart and the area around the heart. It may be done to help find the cause of chest pains or other symptoms of heart disease.  During this procedure, a large X-ray machine, called a CT scanner, takes detailed pictures of the heart and the surrounding area after a dye (contrast material) has been injected into blood vessels in the area.  Ask your health care provider about changing or stopping your regular medicines before the procedure. This is especially important if you are taking diabetes medicines, blood thinners, or medicines to treat erectile dysfunction.  After the procedure, drink water or other fluids to wash (flush) the contrast material out of your body. This information is not intended to replace advice given to you by your health care provider. Make sure you discuss any questions you have with your health care provider. Document Released: 12/31/2007 Document Revised: 12/30/2016 Document Reviewed: 12/07/2015 Elsevier Patient Education  2020 [employer].

## 2018-12-21 ENCOUNTER — Other Ambulatory Visit (HOSPITAL_BASED_OUTPATIENT_CLINIC_OR_DEPARTMENT_OTHER): Payer: 59

## 2018-12-21 ENCOUNTER — Other Ambulatory Visit: Payer: Self-pay

## 2018-12-21 ENCOUNTER — Ambulatory Visit (HOSPITAL_BASED_OUTPATIENT_CLINIC_OR_DEPARTMENT_OTHER)
Admission: RE | Admit: 2018-12-21 | Discharge: 2018-12-21 | Disposition: A | Discharge status: Home / Self Care | Payer: 59 | Source: Ambulatory Visit | Attending: Cardiology | Admitting: Cardiology

## 2018-12-21 DIAGNOSIS — R002 Palpitations: Secondary | ICD-10-CM | POA: Insufficient documentation

## 2018-12-21 DIAGNOSIS — I10 Essential (primary) hypertension: Secondary | ICD-10-CM

## 2018-12-21 DIAGNOSIS — R079 Chest pain, unspecified: Secondary | ICD-10-CM | POA: Diagnosis not present

## 2018-12-21 NOTE — Progress Notes (Signed)
  Echocardiogram 2D Echocardiogram has been performed.  Margaret Carney 12/21/2018, 3:47 PM

## 2019-01-23 ENCOUNTER — Telehealth (HOSPITAL_COMMUNITY): Payer: Self-pay | Admitting: Emergency Medicine

## 2019-01-23 LAB — BASIC METABOLIC PANEL
BUN/Creatinine Ratio: 22 (ref 9–23)
BUN: 19 mg/dL (ref 6–24)
CO2: 25 mmol/L (ref 20–29)
Calcium: 10.1 mg/dL (ref 8.7–10.2)
Chloride: 106 mmol/L (ref 96–106)
Creatinine, Ser: 0.86 mg/dL (ref 0.57–1.00)
GFR calc Af Amer: 86 mL/min/{1.73_m2} (ref >59)
GFR calc non Af Amer: 75 mL/min/{1.73_m2} (ref >59)
Glucose: 70 mg/dL (ref 65–99)
Potassium: 4.1 mmol/L (ref 3.5–5.2)
Sodium: 144 mmol/L (ref 134–144)

## 2019-01-23 NOTE — Telephone Encounter (Signed)
Left message on voicemail with name and callback number Kavin Weckwerth RN Navigator Cardiac Imaging [facility] [phone] Office [phone] Cell  

## 2019-01-28 ENCOUNTER — Other Ambulatory Visit: Payer: Self-pay

## 2019-01-28 ENCOUNTER — Ambulatory Visit (HOSPITAL_COMMUNITY)
Admission: RE | Admit: 2019-01-28 | Discharge: 2019-01-28 | Disposition: A | Discharge status: Home / Self Care | Payer: 59 | Source: Ambulatory Visit | Attending: Cardiology | Admitting: Cardiology

## 2019-01-28 DIAGNOSIS — R079 Chest pain, unspecified: Secondary | ICD-10-CM

## 2019-01-28 MED ORDER — IOHEXOL 350 MG/ML SOLN
100.0000 mL | Freq: Once | INTRAVENOUS | Status: AC | PRN
  Administered 2019-01-28: 100 mL via INTRAVENOUS

## 2019-01-28 MED ORDER — NITROGLYCERIN 0.4 MG SL SUBL: 0.8000 mg | SUBLINGUAL_TABLET | SUBLINGUAL | Status: DC | PRN

## 2019-01-28 MED ORDER — METOPROLOL TARTRATE 5 MG/5ML IV SOLN
INTRAVENOUS | Status: AC
  Filled 2019-01-28: qty 20

## 2019-01-28 MED ORDER — METOPROLOL TARTRATE 5 MG/5ML IV SOLN: 5.0000 mg | INTRAVENOUS | Status: DC | PRN

## 2019-01-28 MED ORDER — NITROGLYCERIN 0.4 MG SL SUBL
SUBLINGUAL_TABLET | SUBLINGUAL | Status: AC
  Filled 2019-01-28: qty 2

## 2019-03-08 ENCOUNTER — Ambulatory Visit: Payer: 59 | Admitting: Cardiology

## 2019-03-28 ENCOUNTER — Encounter: Payer: Self-pay | Admitting: Cardiology

## 2019-03-28 ENCOUNTER — Ambulatory Visit: Payer: 59 | Admitting: Cardiology

## 2019-03-28 ENCOUNTER — Other Ambulatory Visit: Payer: Self-pay

## 2019-03-28 VITALS — BP 124/84 | HR 56 | Ht 68.0 in | Wt 146.0 lb

## 2019-03-28 DIAGNOSIS — I493 Ventricular premature depolarization: Secondary | ICD-10-CM

## 2019-03-28 NOTE — Patient Instructions (Signed)
Medication Instructions:  Your physician recommends that you continue on your current medications as directed. Please refer to the Current Medication list given to you today.  *If you need a refill on your cardiac medications before your next appointment, please call your pharmacy*   Lab Work: NONE  If you have labs (blood work) drawn today and your tests are completely normal, you will receive your results only by: Marland Kitchen MyChart Message (if you have MyChart) OR . A paper copy in the mail If you have any lab test that is abnormal or we need to change your treatment, we will call you to review the results.   Testing/Procedures: NONE    Follow-Up: At [facility], you and your health needs are our priority.  As part of our continuing mission to provide you with exceptional heart care, we have created designated Provider Care Teams.  These Care Teams include your primary Cardiologist (physician) and Advanced Practice Providers (APPs -  Physician Assistants and Nurse Practitioners) who all work together to provide you with the care you need, when you need it.  We recommend signing up for the patient portal called "MyChart".  Sign up information is provided on this After Visit Summary.  MyChart is used to connect with patients for Virtual Visits (Telemedicine).  Patients are able to view lab/test results, encounter notes, upcoming appointments, etc.  Non-urgent messages can be sent to your provider as well.   To learn more about what you can do with MyChart, go to [email or web address].    Your next appointment:   6 month(s)  The format for your next appointment:   In Person  Provider:   Berniece Salines, DO   Other Instructions Thank you for choosing [facility]!

## 2019-03-28 NOTE — Progress Notes (Signed)
Cardiology Office Note:    Date:  03/28/2019   ID:  Margaret Carney, DOB 10-Jan-1961, MRN [id number]  PCP:  Kelton Pillar, MD  Cardiologist:  Berniece Salines, DO  Electrophysiologist:  None   Referring MD: Kelton Pillar, MD   Follow-up visit for symptomatic PVCs.  History of Present Illness:    Margaret Carney is a 59 y.o. female with a hx of anxiety, hyperlipidemia, intermittent PVCs initially presented on December 11, 2018 to be evaluated for chest pain palpitations.  At conclusion her visit I recommended patient undergo a coronary CTA as well as were ZIO monitor.  In the interim the patient was able to wear a ZIO monitor which show evidence of symptomatic PVCs and PACs.  As well as her coronary CTA showed 0 calcium score with no evidence of coronary artery blockages.  She was started previously on Toprol-XL 12.5 mg daily.  Today patient is here for follow-up visit she tells me that she has been doing well from a cardiovascular standpoint.  The symptom her palpitation has improved significantly since starting her Toprol-XL.  She does have some rare intermittent palpitations but she tells me overall she feels better.  No other complaints at this time  Past Medical History:  Diagnosis Date  . Allergy    seasonal  . Anxiety   . Complication of anesthesia   . Cushing's disease ([place])   . Diverticulosis 2013  . GERD (gastroesophageal reflux disease)   . Headache    migraines  . Heart murmur   . Hx of colonic polyps 2013   Tubular Adenoma 2 mm rectal  . Hyperlipidemia   . IBS (irritable bowel syndrome)   . Internal and external hemorrhoids without complication 0000000  . Kidney stone on right side   . Migraines   . MVP (mitral valve prolapse)   . Osteopenia   . PONV (postoperative nausea and vomiting)    needs Scoploamine patch and meds. to prevent nausea- had surgery 05/30/2014 and was given these  . Proctalgia fugax     Past Surgical History:  Procedure Laterality Date  .  BREAST SURGERY     right breast biopsy-benign  . COLONOSCOPY W/ BIOPSIES  2013  . CYSTOSCOPY Left 05/30/2014   Procedure: CYSTOSCOPY/LEFT RETROGRADE PYELOGRAM/PLACEMENT URETERAL STENT;  Surgeon: Alexis Frock, MD;  Location: [facility];  Service: Urology;  Laterality: Left;  . CYSTOSCOPY/URETEROSCOPY/HOLMIUM LASER/STENT PLACEMENT Left 06/24/2014   Procedure: CYSTOSCOPY, BILATERAL URETEROSCOPY, BILATERAL STENT PLACEMENT;  Surgeon: Festus Aloe, MD;  Location: [facility];  Service: Urology;  Laterality: Left;  . ESOPHAGOGASTRODUODENOSCOPY    . HOLMIUM LASER APPLICATION Left [id number]   Procedure: HOLMIUM LASER LITHOTRIPSY ;  Surgeon: Festus Aloe, MD;  Location: [facility];  Service: Urology;  Laterality: Left;  . LAPAROSCOPIC ENDOMETRIOSIS FULGURATION  1989  . LITHOTRIPSY     Nephrolithiasis  . pitituary tumor  1989  . TONSILLECTOMY AND ADENOIDECTOMY  1968    Current Medications: Current Meds  Medication Sig  . aspirin-acetaminophen-caffeine (EXCEDRIN MIGRAINE) [id number] MG per tablet Take 1 tablet by mouth every 8 (eight) hours as needed for headache.  Marland Kitchen atorvastatin (LIPITOR) 20 MG tablet TK 1 T PO QD  . Calcium-Vitamin D-Vitamin K (CALCIUM SOFT CHEWS PO) Take 1 each by mouth at bedtime.   . metoprolol succinate (TOPROL XL) 25 MG 24 hr tablet Take 0.5 tablets (12.5 mg total) by mouth daily.  . Multiple Vitamin (MULTIVITAMIN WITH MINERALS) TABS tablet Take 1 tablet by mouth at bedtime.  Marland Kitchen omeprazole (  PRILOSEC) 40 MG capsule Take 40 mg by mouth as needed.  . ondansetron (ZOFRAN ODT) 4 MG disintegrating tablet Take 1 tablet (4 mg total) by mouth every 4 (four) hours as needed for nausea or vomiting.  Vladimir Faster Glycol-Propyl Glycol (SYSTANE OP) Apply 1-2 drops to eye daily as needed (dry eyes.).  Marland Kitchen SUMAtriptan (IMITREX) 50 MG tablet Take one tablet at headache onset, if no improvement, may repeat in 2 hours if headache persists or recurs.  . [DISCONTINUED] omeprazole (PRILOSEC) 40 MG capsule  omeprazole 40 mg capsule,delayed release  TK ONE C PO QD   Current Facility-Administered Medications for the 03/28/19 encounter (Office Visit) with Berniece Salines, DO  Medication  . 0.9 %  sodium chloride infusion     Allergies:   Codeine   Social History   Socioeconomic History  . Marital status: Widowed    Spouse name: Not on file  . Number of children: 1  . Years of education: Not on file  . Highest education level: Not on file  Occupational History  . Occupation: Herbalist: [employer]  Tobacco Use  . Smoking status: Never Smoker  . Smokeless tobacco: Never Used  Substance and Sexual Activity  . Alcohol use: No  . Drug use: No  . Sexual activity: Never  Other Topics Concern  . Not on file  Social History Narrative   Widowed 2018  one son.   She works as a Government social research officer.   Highest level of education:  Two years of college   Social Determinants of Health   Financial Resource Strain:   . Difficulty of Paying Living Expenses: Not on file  Food Insecurity:   . Worried About Charity fundraiser in the Last Year: Not on file  . Ran Out of Food in the Last Year: Not on file  Transportation Needs:   . Lack of Transportation (Medical): Not on file  . Lack of Transportation (Non-Medical): Not on file  Physical Activity:   . Days of Exercise per Week: Not on file  . Minutes of Exercise per Session: Not on file  Stress:   . Feeling of Stress : Not on file  Social Connections:   . Frequency of Communication with Friends and Family: Not on file  . Frequency of Social Gatherings with Friends and Family: Not on file  . Attends Religious Services: Not on file  . Active Member of Clubs or Organizations: Not on file  . Attends Archivist Meetings: Not on file  . Marital Status: Not on file     Family History: The patient's family history includes Breast cancer in her sister; Colon polyps in her mother; Dementia in her mother; Diabetes in  her father; Heart disease in her paternal grandmother; Hypertension in her mother. There is no history of Colon cancer, Stomach cancer, Ulcerative colitis, Esophageal cancer, or Rectal cancer.  ROS:   Review of Systems  Constitution: Negative for decreased appetite, fever and weight gain.  HENT: Negative for congestion, ear discharge, hoarse voice and sore throat.   Eyes: Negative for discharge, redness, vision loss in right eye and visual halos.  Cardiovascular: Negative for chest pain, dyspnea on exertion, leg swelling, orthopnea and palpitations.  Respiratory: Negative for cough, hemoptysis, shortness of breath and snoring.   Endocrine: Negative for heat intolerance and polyphagia.  Hematologic/Lymphatic: Negative for bleeding problem. Does not bruise/bleed easily.  Skin: Negative for flushing, nail changes, rash and suspicious lesions.  Musculoskeletal: Negative  for arthritis, joint pain, muscle cramps, myalgias, neck pain and stiffness.  Gastrointestinal: Negative for abdominal pain, bowel incontinence, diarrhea and excessive appetite.  Genitourinary: Negative for decreased libido, genital sores and incomplete emptying.  Neurological: Negative for brief paralysis, focal weakness, headaches and loss of balance.  Psychiatric/Behavioral: Negative for altered mental status, depression and suicidal ideas.  Allergic/Immunologic: Negative for HIV exposure and persistent infections.    EKGs/Labs/Other Studies Reviewed:    The following studies were reviewed today:   EKG: None today  TTE  Impression  12/21/18 IMPRESSIONS  1. Left ventricular ejection fraction, by visual estimation, is 60 to  65%. The left ventricle has normal function. There is no left ventricular  hypertrophy.  2. Global right ventricle has normal systolic function.The right  ventricular size is normal. No increase in right ventricular wall  thickness.  3. Left atrial size was normal.  4. Right atrial size was  normal.  5. The mitral valve is normal in structure. No evidence of mitral valve  regurgitation. No evidence of mitral stenosis.  6. The tricuspid valve is normal in structure. Tricuspid valve  regurgitation is trivial.  7. The aortic valve is normal in structure. Aortic valve regurgitation is  not visualized. No evidence of aortic valve sclerosis or stenosis.  8. The pulmonic valve was normal in structure. Pulmonic valve  regurgitation is not visualized.  9. Normal pulmonary artery systolic pressure.  10. The inferior vena cava is normal in size with greater than 50%  respiratory variability, suggesting right atrial pressure of 3 mmHg.   Recent Labs: 12/09/2018: Hemoglobin 17.1; Platelets 258 01/22/2019: BUN 19; Creatinine, Ser 0.86; Potassium 4.1; Sodium 144   Recent Lipid Panel    Component Value Date/Time   CHOL (H) 09/23/2008 0905    205        ATP III CLASSIFICATION:  <200     mg/dL   Desirable  200-239  mg/dL   Borderline High  >=240    mg/dL   High          TRIG 35 09/23/2008 0905   HDL 60 09/23/2008 0905   CHOLHDL 3.4 09/23/2008 0905   VLDL 7 09/23/2008 0905   LDLCALC (H) 09/23/2008 0905    138        Total Cholesterol/HDL:CHD Risk Coronary Heart Disease Risk Table                     Men   Women  1/2 Average Risk   3.4   3.3  Average Risk       5.0   4.4  2 X Average Risk   9.6   7.1  3 X Average Risk  23.4   11.0        Use the calculated Patient Ratio above and the CHD Risk Table to determine the patient's CHD Risk.        ATP III CLASSIFICATION (LDL):  <100     mg/dL   Optimal  100-129  mg/dL   Near or Above                    Optimal  130-159  mg/dL   Borderline  160-189  mg/dL   High  >190     mg/dL   Very High    Physical Exam:    VS:  BP 124/84   Pulse (!) 56   Ht 5\' 8"  (1.727 m)   Wt 146 lb 0.6 oz (66.2 kg)  LMP 09/08/2010   SpO2 99%   BMI 22.21 kg/m     Wt Readings from Last 3 Encounters:  03/28/19 146 lb 0.6 oz (66.2 kg)   12/11/18 147 lb (66.7 kg)  12/09/18 140 lb (63.5 kg)     GEN: Well nourished, well developed in no acute distress HEENT: Normal NECK: No JVD; No carotid bruits LYMPHATICS: No lymphadenopathy CARDIAC: S1S2 noted,RRR, no murmurs, rubs, gallops RESPIRATORY:  Clear to auscultation without rales, wheezing or rhonchi  ABDOMEN: Soft, non-tender, non-distended, +bowel sounds, no guarding. EXTREMITIES: No edema, No cyanosis, no clubbing MUSCULOSKELETAL:  No deformity  SKIN: Warm and dry NEUROLOGIC:  Alert and oriented x 3, non-focal PSYCHIATRIC:  Normal affect, good insight  ASSESSMENT:    1. Symptomatic PVCs    PLAN:     The patient symptoms have improved on the Toprol xl 12.5mg  daily . I will continue her on current medication regimen.    The patient is in agreement with the above plan. The patient left the office in stable condition.  The patient will follow up in 6 months.   Medication Adjustments/Labs and Tests Ordered: Current medicines are reviewed at length with the patient today.  Concerns regarding medicines are outlined above.  No orders of the defined types were placed in this encounter.  No orders of the defined types were placed in this encounter.   Patient Instructions  Medication Instructions:  Your physician recommends that you continue on your current medications as directed. Please refer to the Current Medication list given to you today.  *If you need a refill on your cardiac medications before your next appointment, please call your pharmacy*   Lab Work: NONE  If you have labs (blood work) drawn today and your tests are completely normal, you will receive your results only by: Marland Kitchen MyChart Message (if you have MyChart) OR . A paper copy in the mail If you have any lab test that is abnormal or we need to change your treatment, we will call you to review the results.   Testing/Procedures: NONE    Follow-Up: At [facility], you and your health needs  are our priority.  As part of our continuing mission to provide you with exceptional heart care, we have created designated Provider Care Teams.  These Care Teams include your primary Cardiologist (physician) and Advanced Practice Providers (APPs -  Physician Assistants and Nurse Practitioners) who all work together to provide you with the care you need, when you need it.  We recommend signing up for the patient portal called "MyChart".  Sign up information is provided on this After Visit Summary.  MyChart is used to connect with patients for Virtual Visits (Telemedicine).  Patients are able to view lab/test results, encounter notes, upcoming appointments, etc.  Non-urgent messages can be sent to your provider as well.   To learn more about what you can do with MyChart, go to [email or web address].    Your next appointment:   6 month(s)  The format for your next appointment:   In Person  Provider:   Berniece Salines, DO   Other Instructions Thank you for choosing [facility]!       Adopting a Healthy Lifestyle.  Know what a healthy weight is for you (roughly BMI <25) and aim to maintain this   Aim for 7+ servings of fruits and vegetables daily   65-80+ fluid ounces of water or unsweet tea for healthy kidneys   Limit to max 1 drink of alcohol  per day; avoid smoking/tobacco   Limit animal fats in diet for cholesterol and heart health - choose grass fed whenever available   Avoid highly processed foods, and foods high in saturated/trans fats   Aim for low stress - take time to unwind and care for your mental health   Aim for 150 min of moderate intensity exercise weekly for heart health, and weights twice weekly for bone health   Aim for 7-9 hours of sleep daily   When it comes to diets, agreement about the perfect plan isnt easy to find, even among the experts. Experts at the [employer] developed an idea known as the Healthy Eating Plate. Just  imagine a plate divided into logical, healthy portions.   The emphasis is on diet quality:   Load up on vegetables and fruits - one-half of your plate: Aim for color and variety, and remember that potatoes dont count.   Go for whole grains - one-quarter of your plate: Whole wheat, barley, wheat berries, quinoa, oats, brown rice, and foods made with them. If you want pasta, go with whole wheat pasta.   Protein power - one-quarter of your plate: Fish, chicken, beans, and nuts are all healthy, versatile protein sources. Limit red meat.   The diet, however, does go beyond the plate, offering a few other suggestions.   Use healthy plant oils, such as olive, canola, soy, corn, sunflower and peanut. Check the labels, and avoid partially hydrogenated oil, which have unhealthy trans fats.   If youre thirsty, drink water. Coffee and tea are good in moderation, but skip sugary drinks and limit milk and dairy products to one or two daily servings.   The type of carbohydrate in the diet is more important than the amount. Some sources of carbohydrates, such as vegetables, fruits, whole grains, and beans-are healthier than others.   Finally, stay active  Signed, Berniece Salines, DO  03/28/2019 5:01 PM    [facility]

## 2019-06-24 ENCOUNTER — Other Ambulatory Visit: Payer: Self-pay | Admitting: *Deleted

## 2019-06-24 MED ORDER — METOPROLOL SUCCINATE ER 25 MG PO TB24: 12.5000 mg | ORAL_TABLET | Freq: Every day | ORAL | 0 refills | Status: DC

## 2019-06-24 NOTE — Telephone Encounter (Signed)
Received a faxed request from pharmacy for Metoprolol ER Succinate 25 mg. Rx sent to pharmacy on file for #45 and 0 refills since patient only takes 1/2 tablet daily.

## 2019-08-08 IMAGING — CT CT ABD-PELV W/ CM
2 of 5 series · 16 of 46 positions shown, 18 images · IV contrast (ISOVUE 300)
Comparison: 04/21/2016 and prior CTs

CLINICAL DATA: 56-year-old female with chronic right abdominal and
flank pain

EXAM:
CT ABDOMEN AND PELVIS WITH CONTRAST
TECHNIQUE: Multidetector CT imaging of the abdomen and pelvis was performed
using the standard protocol following bolus administration of
intravenous contrast.
CONTRAST:  100mL 92YRMT-WJJ IOPAMIDOL (92YRMT-WJJ) INJECTION 61%

[Series 2: abd/pel w · axial · 0.73mm/px · z∈[-444,+1]mm · 13 of 99 slices shown, 15 images]
[im 5/99  soft-tissue]
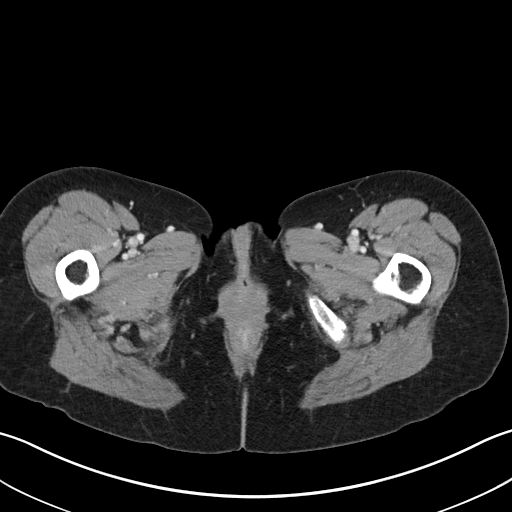
[im 5/99  bone]
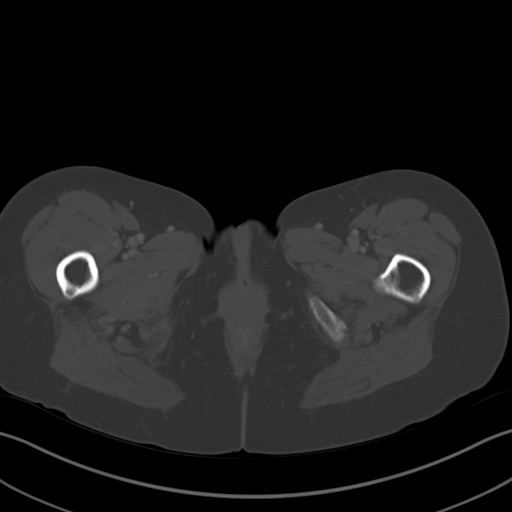
[im 15/99  soft-tissue]
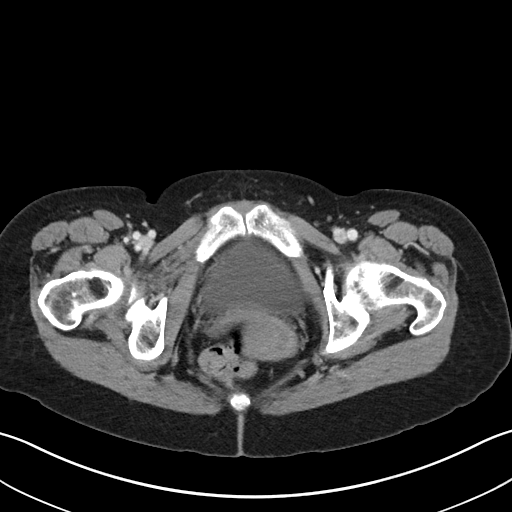
[im 20/99  soft-tissue]
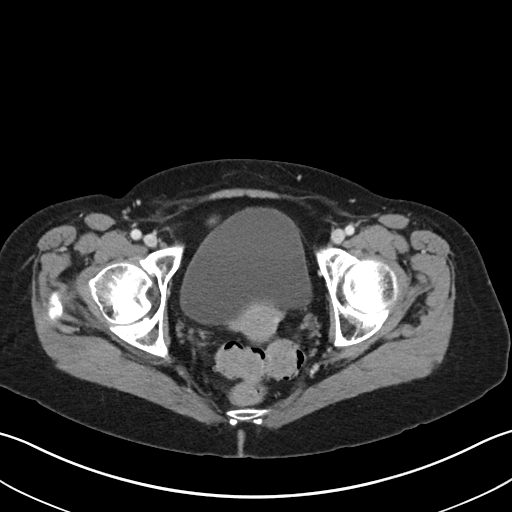
[im 30/99  soft-tissue]
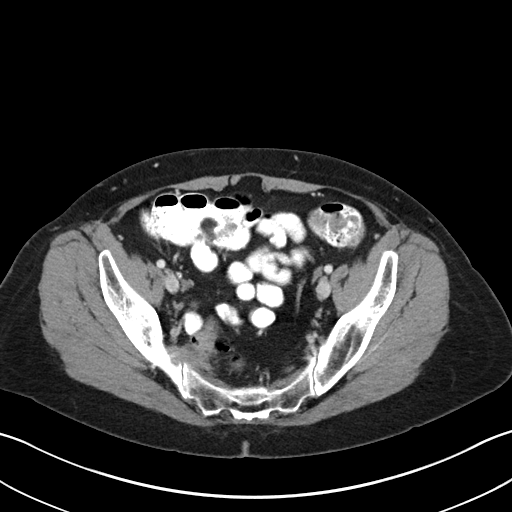
[im 35/99  soft-tissue]
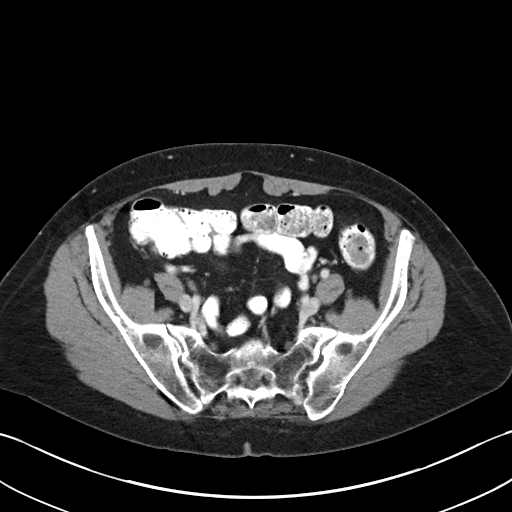
[im 45/99  soft-tissue]
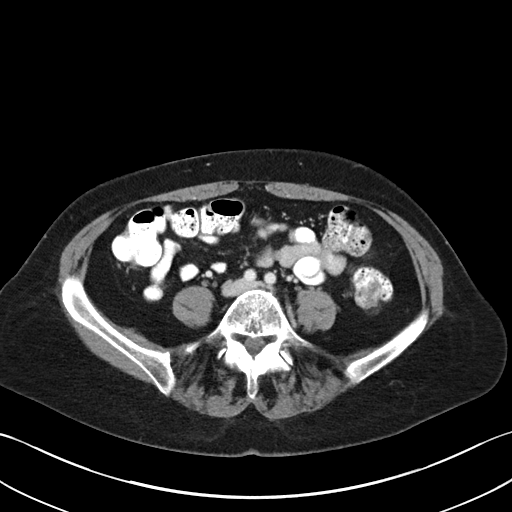
[im 50/99  soft-tissue]
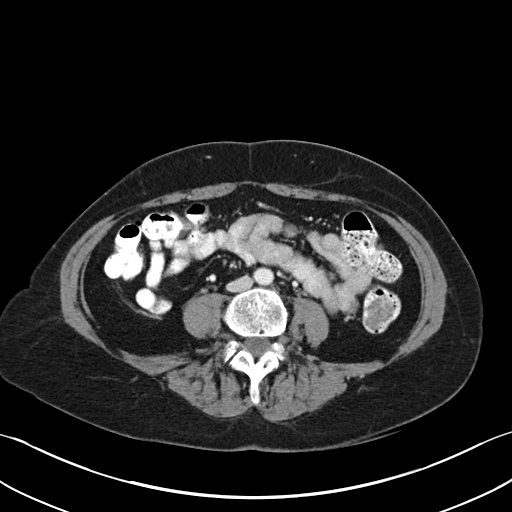
[im 54/99  soft-tissue]
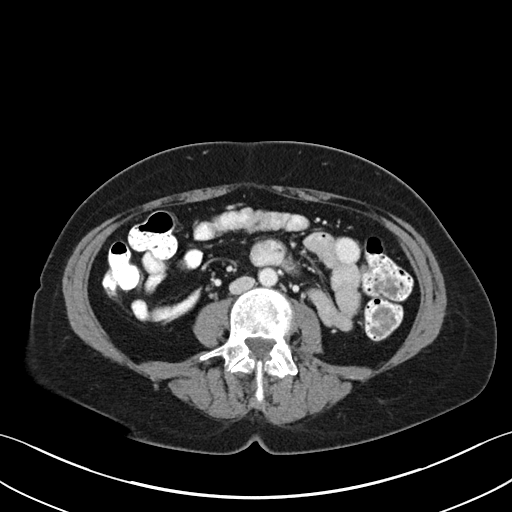
[im 64/99  soft-tissue]
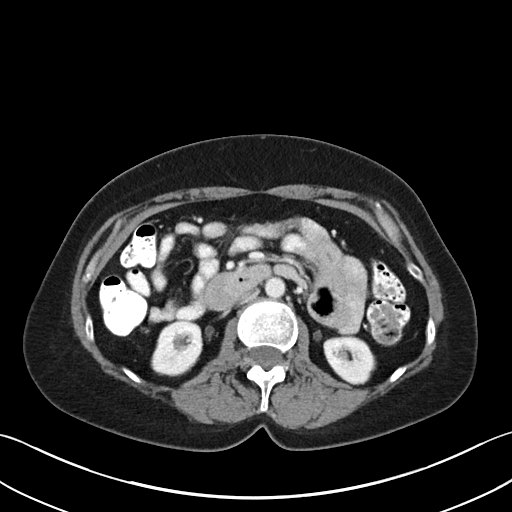
[im 64/99  bone]
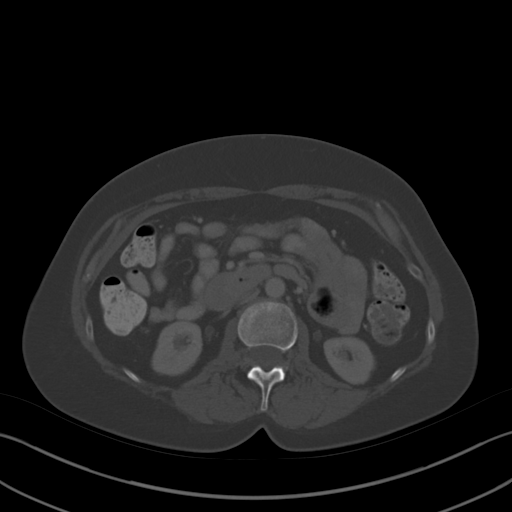
[im 69/99  soft-tissue]
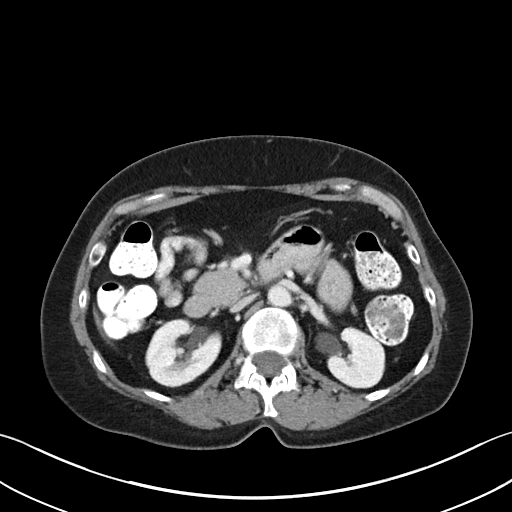
[im 79/99  soft-tissue]
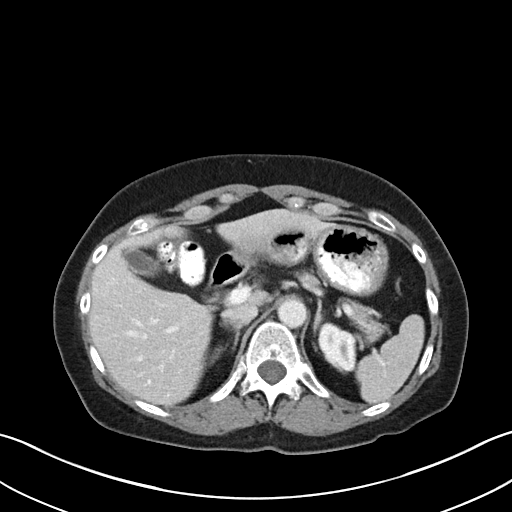
[im 84/99  soft-tissue]
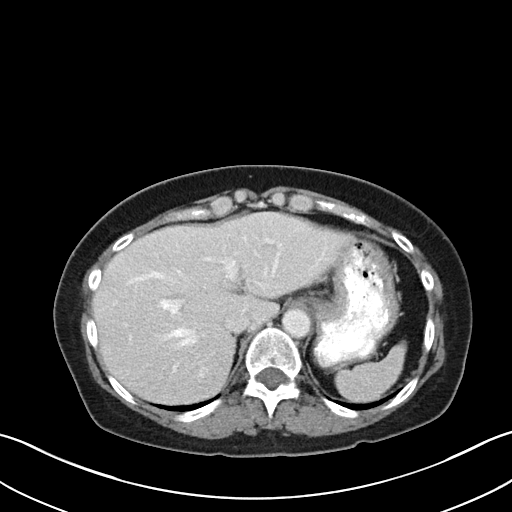
[im 94/99  soft-tissue]
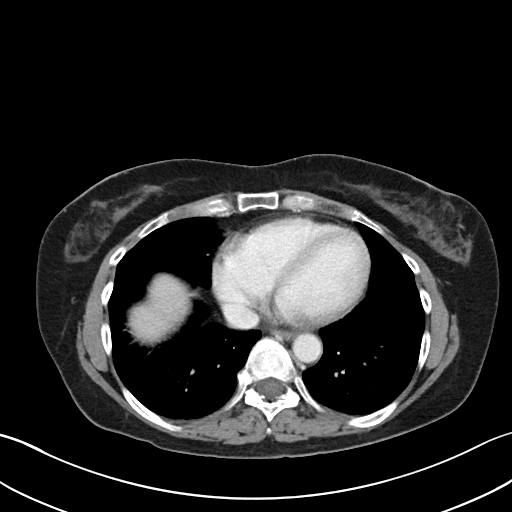

[Series 6: abd/pel w st · coronal · 0.65mm/px · 3 of 72 slices shown]
[im 24/72  soft-tissue]
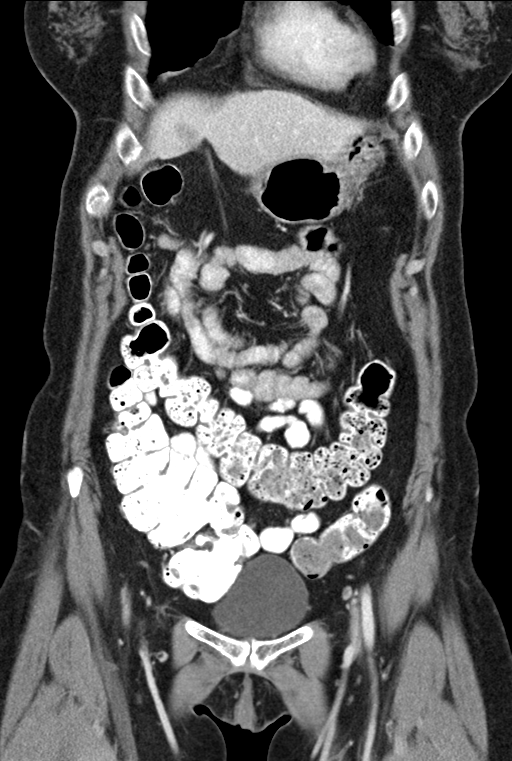
[im 32/72  soft-tissue]
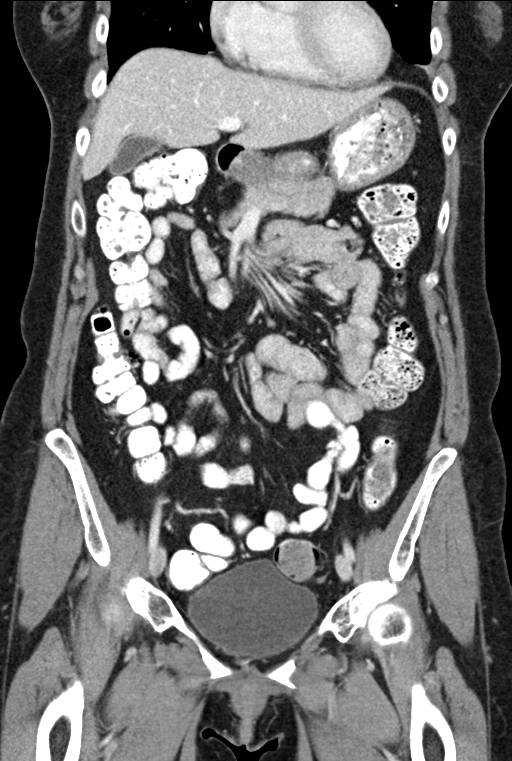
[im 40/72  soft-tissue]
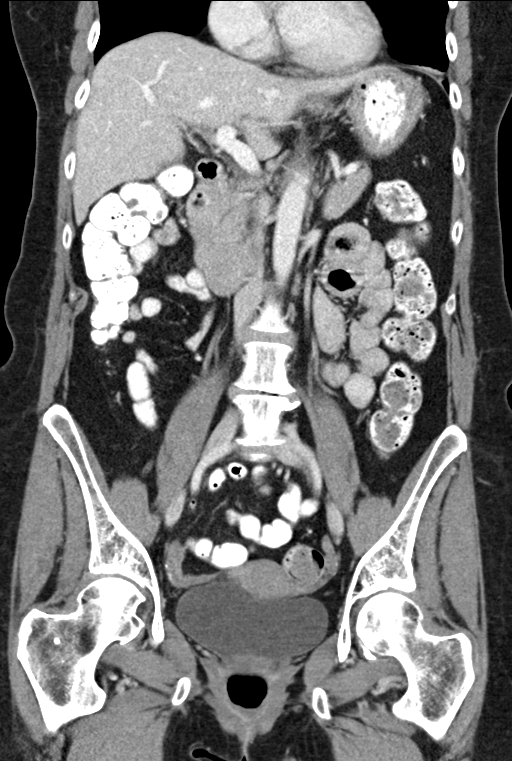

[16 of 46 positions shown; findings below may reference images not displayed]

FINDINGS: Lower chest: No acute or significant abnormality.

Hepatobiliary: A 1.2 cm central hepatic cyst is again noted. No
significant hepatic or gallbladder abnormalities noted. No biliary
dilatation.

Pancreas: Unremarkable

Spleen: Unremarkable

Adrenals/Urinary Tract: The kidneys, adrenal glands and bladder are
unremarkable.

Stomach/Bowel: Stomach is within normal limits. Appendix appears
normal. No evidence of bowel wall thickening, distention, or
inflammatory changes.

Vascular/Lymphatic: No significant vascular findings are present. No
enlarged abdominal or pelvic lymph nodes.

Reproductive: Uterus and bilateral adnexa are unremarkable.

Other: No ascites, hernia, focal collection or pneumoperitoneum.

Musculoskeletal: No acute osseous findings. Mild degenerative disc
disease in the lower lumbar spine noted.
IMPRESSION: No acute or significant abnormality. No findings to suggest a cause
for this patient's abdominal pain.

## 2019-10-03 ENCOUNTER — Other Ambulatory Visit: Payer: Self-pay | Admitting: Cardiology

## 2019-10-03 ENCOUNTER — Telehealth: Payer: Self-pay | Admitting: Cardiology

## 2019-10-03 NOTE — Telephone Encounter (Signed)
Follow Up:    Pt said pharmacist called and said her Metoprolol was denied.

## 2019-10-03 NOTE — Telephone Encounter (Signed)
Spoke to the patient just now and let her know that I just spoke with the pharmacy and they let me know that the medication has not been denied and they are going to get it ready for her. She verbalizes understanding and thanks me for the call back.

## 2019-10-09 ENCOUNTER — Other Ambulatory Visit: Payer: Self-pay

## 2019-10-09 ENCOUNTER — Ambulatory Visit (INDEPENDENT_AMBULATORY_CARE_PROVIDER_SITE_OTHER): Payer: 59 | Admitting: Cardiology

## 2019-10-09 ENCOUNTER — Encounter: Payer: Self-pay | Admitting: Cardiology

## 2019-10-09 VITALS — BP 114/68 | HR 54 | Ht 68.0 in | Wt 144.1 lb

## 2019-10-09 DIAGNOSIS — I10 Essential (primary) hypertension: Secondary | ICD-10-CM

## 2019-10-09 DIAGNOSIS — I493 Ventricular premature depolarization: Secondary | ICD-10-CM | POA: Diagnosis not present

## 2019-10-09 DIAGNOSIS — R202 Paresthesia of skin: Secondary | ICD-10-CM

## 2019-10-09 NOTE — Progress Notes (Signed)
Cardiology Office Note:    Date:  10/09/2019   ID:  Margaret Carney, DOB Oct 14, 1960, MRN [id number]  PCP:  Kelton Pillar, MD  Cardiologist:  Berniece Salines, DO  Electrophysiologist:  None   Referring MD: Kelton Pillar, MD   "I have been having some tingling"  History of Present Illness:    Margaret Carney is a 59 y.o. female with a hx of   Past Medical History:  Diagnosis Date  . Allergy    seasonal  . Anxiety   . Complication of anesthesia   . Cushing's disease ([place])   . Diverticulosis 2013  . GERD (gastroesophageal reflux disease)   . Headache    migraines  . Heart murmur   . Hx of colonic polyps 2013   Tubular Adenoma 2 mm rectal  . Hyperlipidemia   . IBS (irritable bowel syndrome)   . Internal and external hemorrhoids without complication 7425  . Kidney stone on right side   . Migraines   . MVP (mitral valve prolapse)   . Osteopenia   . PONV (postoperative nausea and vomiting)    needs Scoploamine patch and meds. to prevent nausea- had surgery 05/30/2014 and was given these  . Proctalgia fugax     Past Surgical History:  Procedure Laterality Date  . BREAST SURGERY     right breast biopsy-benign  . COLONOSCOPY W/ BIOPSIES  2013  . CYSTOSCOPY Left 05/30/2014   Procedure: CYSTOSCOPY/LEFT RETROGRADE PYELOGRAM/PLACEMENT URETERAL STENT;  Surgeon: Alexis Frock, MD;  Location: [facility];  Service: Urology;  Laterality: Left;  . CYSTOSCOPY/URETEROSCOPY/HOLMIUM LASER/STENT PLACEMENT Left 06/24/2014   Procedure: CYSTOSCOPY, BILATERAL URETEROSCOPY, BILATERAL STENT PLACEMENT;  Surgeon: Festus Aloe, MD;  Location: [facility];  Service: Urology;  Laterality: Left;  . ESOPHAGOGASTRODUODENOSCOPY    . HOLMIUM LASER APPLICATION Left 9/56/3875   Procedure: HOLMIUM LASER LITHOTRIPSY ;  Surgeon: Festus Aloe, MD;  Location: [facility];  Service: Urology;  Laterality: Left;  . LAPAROSCOPIC ENDOMETRIOSIS FULGURATION  1989  . LITHOTRIPSY     Nephrolithiasis  . pitituary tumor  1989    . TONSILLECTOMY AND ADENOIDECTOMY  1968    Current Medications: Current Meds  Medication Sig  . aspirin-acetaminophen-caffeine (EXCEDRIN MIGRAINE) 643-329-51 MG per tablet Take 1 tablet by mouth every 8 (eight) hours as needed for headache.  Marland Kitchen atorvastatin (LIPITOR) 20 MG tablet Take 20 mg by mouth daily.   . Calcium-Vitamin D-Vitamin K (CALCIUM SOFT CHEWS PO) Take 1 each by mouth at bedtime.   . metoprolol succinate (TOPROL-XL) 25 MG 24 hr tablet TAKE 1/2 TABLET(12.5 MG) BY MOUTH DAILY  . Multiple Vitamin (MULTIVITAMIN WITH MINERALS) TABS tablet Take 1 tablet by mouth at bedtime.  Marland Kitchen omeprazole (PRILOSEC) 40 MG capsule Take 40 mg by mouth as needed.  . ondansetron (ZOFRAN ODT) 4 MG disintegrating tablet Take 1 tablet (4 mg total) by mouth every 4 (four) hours as needed for nausea or vomiting.  Vladimir Faster Glycol-Propyl Glycol (SYSTANE OP) Apply 1-2 drops to eye daily as needed (dry eyes.).  Marland Kitchen sertraline (ZOLOFT) 50 MG tablet Take 25 mg by mouth daily.  . SUMAtriptan (IMITREX) 100 MG tablet Take 100 mg by mouth daily as needed.   Current Facility-Administered Medications for the 10/09/19 encounter (Office Visit) with Berniece Salines, DO  Medication  . 0.9 %  sodium chloride infusion     Allergies:   Codeine   Social History   Socioeconomic History  . Marital status: Widowed    Spouse name: Not on file  .  Number of children: 1  . Years of education: Not on file  . Highest education level: Not on file  Occupational History  . Occupation: Herbalist: [employer]  Tobacco Use  . Smoking status: Never Smoker  . Smokeless tobacco: Never Used  Vaping Use  . Vaping Use: Never used  Substance and Sexual Activity  . Alcohol use: No  . Drug use: No  . Sexual activity: Never  Other Topics Concern  . Not on file  Social History Narrative   Widowed 2018  one son.   She works as a Government social research officer.   Highest level of education:  Two years of college   Social  Determinants of Health   Financial Resource Strain:   . Difficulty of Paying Living Expenses: Not on file  Food Insecurity:   . Worried About Charity fundraiser in the Last Year: Not on file  . Ran Out of Food in the Last Year: Not on file  Transportation Needs:   . Lack of Transportation (Medical): Not on file  . Lack of Transportation (Non-Medical): Not on file  Physical Activity:   . Days of Exercise per Week: Not on file  . Minutes of Exercise per Session: Not on file  Stress:   . Feeling of Stress : Not on file  Social Connections:   . Frequency of Communication with Friends and Family: Not on file  . Frequency of Social Gatherings with Friends and Family: Not on file  . Attends Religious Services: Not on file  . Active Member of Clubs or Organizations: Not on file  . Attends Archivist Meetings: Not on file  . Marital Status: Not on file     Family History: The patient's family history includes Breast cancer in her sister; Colon polyps in her mother; Dementia in her mother; Diabetes in her father; Heart disease in her paternal grandmother; Hypertension in her mother. There is no history of Colon cancer, Stomach cancer, Ulcerative colitis, Esophageal cancer, or Rectal cancer.  ROS:   Review of Systems  Constitution: Negative for decreased appetite, fever and weight gain.  HENT: Negative for congestion, ear discharge, hoarse voice and sore throat.   Eyes: Negative for discharge, redness, vision loss in right eye and visual halos.  Cardiovascular: Negative for chest pain, dyspnea on exertion, leg swelling, orthopnea and palpitations.  Respiratory: Negative for cough, hemoptysis, shortness of breath and snoring.   Endocrine: Negative for heat intolerance and polyphagia.  Hematologic/Lymphatic: Negative for bleeding problem. Does not bruise/bleed easily.  Skin: Negative for flushing, nail changes, rash and suspicious lesions.  Musculoskeletal: Negative for arthritis,  joint pain, muscle cramps, myalgias, neck pain and stiffness.  Gastrointestinal: Negative for abdominal pain, bowel incontinence, diarrhea and excessive appetite.  Genitourinary: Negative for decreased libido, genital sores and incomplete emptying.  Neurological: Negative for brief paralysis, focal weakness, headaches and loss of balance.  Psychiatric/Behavioral: Negative for altered mental status, depression and suicidal ideas.  Allergic/Immunologic: Negative for HIV exposure and persistent infections.    EKGs/Labs/Other Studies Reviewed:    The following studies were reviewed today:   EKG:  The ekg ordered today demonstrates sinus bradycardia, heart rate 54 bpm with arrhythmia  The patient wore the monitor for 7 days 2 hours starting  12/11/2018. Indication: Palpitations The minimum heart rate was 45 bpm, maximum heart rate was 121 bpm, and average heart rate was 71  bpm. Predominant underlying rhythm was Sinus Rhythm. Premature atrial complexes were were  rare (<1.0%). Premature Ventricular complexes were rare (<1.0%).  Ventricular Bigeminy was present. No ventricular tachycardia, No pauses, No AV block, No supraventricular tachycardia  and no atrial fibrillation present. 15 patient triggered events: 7 was associated with premature ventricular complexes, 2 associated with supraventricular tachycardia and the remaining is associated sinus rhythm.  Conclusion: This study is remarkable for symptomatic premature ventricular complexes and symptomatic premature atrial complexes.   TTE  Impression  12/21/18 IMPRESSIONS  1. Left ventricular ejection fraction, by visual estimation, is 60 to  65%. The left ventricle has normal function. There is no left ventricular  hypertrophy.  2. Global right ventricle has normal systolic function.The right  ventricular size is normal. No increase in right ventricular wall  thickness.  3. Left atrial size was normal.  4. Right atrial size was  normal.  5. The mitral valve is normal in structure. No evidence of mitral valve  regurgitation. No evidence of mitral stenosis.  6. The tricuspid valve is normal in structure. Tricuspid valve  regurgitation is trivial.  7. The aortic valve is normal in structure. Aortic valve regurgitation is  not visualized. No evidence of aortic valve sclerosis or stenosis.  8. The pulmonic valve was normal in structure. Pulmonic valve  regurgitation is not visualized.  9. Normal pulmonary artery systolic pressure.  10. The inferior vena cava is normal in size with greater than 50%  respiratory variability, suggesting right atrial pressure of 3 mmHg.    Recent Labs: 12/09/2018: Hemoglobin 17.1; Platelets 258 01/22/2019: BUN 19; Creatinine, Ser 0.86; Potassium 4.1; Sodium 144  Recent Lipid Panel    Component Value Date/Time   CHOL (H) 09/23/2008 0905    205        ATP III CLASSIFICATION:  <200     mg/dL   Desirable  200-239  mg/dL   Borderline High  >=240    mg/dL   High          TRIG 35 09/23/2008 0905   HDL 60 09/23/2008 0905   CHOLHDL 3.4 09/23/2008 0905   VLDL 7 09/23/2008 0905   LDLCALC (H) 09/23/2008 0905    138        Total Cholesterol/HDL:CHD Risk Coronary Heart Disease Risk Table                     Men   Women  1/2 Average Risk   3.4   3.3  Average Risk       5.0   4.4  2 X Average Risk   9.6   7.1  3 X Average Risk  23.4   11.0        Use the calculated Patient Ratio above and the CHD Risk Table to determine the patient's CHD Risk.        ATP III CLASSIFICATION (LDL):  <100     mg/dL   Optimal  100-129  mg/dL   Near or Above                    Optimal  130-159  mg/dL   Borderline  160-189  mg/dL   High  >190     mg/dL   Very High    Physical Exam:    VS:  BP 114/68   Pulse (!) 54   Ht 5\' 8"  (1.727 m)   Wt 144 lb 1.3 oz (65.4 kg)   LMP 09/08/2010   SpO2 98%   BMI 21.91 kg/m     Wt  Readings from Last 3 Encounters:  10/09/19 144 lb 1.3 oz (65.4 kg)    03/28/19 146 lb 0.6 oz (66.2 kg)  12/11/18 147 lb (66.7 kg)     GEN: Well nourished, well developed in no acute distress HEENT: Normal NECK: No JVD; No carotid bruits LYMPHATICS: No lymphadenopathy CARDIAC: S1S2 noted,RRR, no murmurs, rubs, gallops RESPIRATORY:  Clear to auscultation without rales, wheezing or rhonchi  ABDOMEN: Soft, non-tender, non-distended, +bowel sounds, no guarding. EXTREMITIES: No edema, No cyanosis, no clubbing MUSCULOSKELETAL:  No deformity  SKIN: Warm and dry NEUROLOGIC:  Alert and oriented x 3, non-focal PSYCHIATRIC:  Normal affect, good insight  ASSESSMENT:    1. Tingling in extremities   2. Symptomatic PVCs   3. Essential hypertension    PLAN:    Her blood pressure deceptively in the office today.  She still does have some symptoms with the PVCs but has improved significantly.  She reported more symptoms of tingling in her extremities.  She has a family history of significant diabetes in her father.  We will get hemoglobin A1c.  She no changes will be made to her medication at this time.  But we discussed in the future that she could hold her beta-blocker if she thinks she wants to try herself off of the medication.   The patient is in agreement with the above plan. The patient left the office in stable condition.  The patient will follow up in 6 months sooner if needed.   Medication Adjustments/Labs and Tests Ordered: Current medicines are reviewed at length with the patient today.  Concerns regarding medicines are outlined above.  No orders of the defined types were placed in this encounter.  No orders of the defined types were placed in this encounter.   There are no Patient Instructions on file for this visit.   Adopting a Healthy Lifestyle.  Know what a healthy weight is for you (roughly BMI <25) and aim to maintain this   Aim for 7+ servings of fruits and vegetables daily   65-80+ fluid ounces of water or unsweet tea for healthy  kidneys   Limit to max 1 drink of alcohol per day; avoid smoking/tobacco   Limit animal fats in diet for cholesterol and heart health - choose grass fed whenever available   Avoid highly processed foods, and foods high in saturated/trans fats   Aim for low stress - take time to unwind and care for your mental health   Aim for 150 min of moderate intensity exercise weekly for heart health, and weights twice weekly for bone health   Aim for 7-9 hours of sleep daily   When it comes to diets, agreement about the perfect plan isnt easy to find, even among the experts. Experts at the [employer] developed an idea known as the Healthy Eating Plate. Just imagine a plate divided into logical, healthy portions.   The emphasis is on diet quality:   Load up on vegetables and fruits - one-half of your plate: Aim for color and variety, and remember that potatoes dont count.   Go for whole grains - one-quarter of your plate: Whole wheat, barley, wheat berries, quinoa, oats, brown rice, and foods made with them. If you want pasta, go with whole wheat pasta.   Protein power - one-quarter of your plate: Fish, chicken, beans, and nuts are all healthy, versatile protein sources. Limit red meat.   The diet, however, does go beyond the plate, offering a few  other suggestions.   Use healthy plant oils, such as olive, canola, soy, corn, sunflower and peanut. Check the labels, and avoid partially hydrogenated oil, which have unhealthy trans fats.   If youre thirsty, drink water. Coffee and tea are good in moderation, but skip sugary drinks and limit milk and dairy products to one or two daily servings.   The type of carbohydrate in the diet is more important than the amount. Some sources of carbohydrates, such as vegetables, fruits, whole grains, and beans-are healthier than others.   Finally, stay active  Signed, Berniece Salines, DO  10/09/2019 11:06 AM    [place]

## 2019-10-09 NOTE — Patient Instructions (Signed)
Medication Instructions:  Your physician recommends that you continue on your current medications as directed. Please refer to the Current Medication list given to you today.  *If you need a refill on your cardiac medications before your next appointment, please call your pharmacy*   Lab Work: Your physician recommends that you return for lab work in: TODAY BMP, Mag, HGB A1C If you have labs (blood work) drawn today and your tests are completely normal, you will receive your results only by: Marland Kitchen MyChart Message (if you have MyChart) OR . A paper copy in the mail If you have any lab test that is abnormal or we need to change your treatment, we will call you to review the results.   Testing/Procedures: None   Follow-Up: At [facility], you and your health needs are our priority.  As part of our continuing mission to provide you with exceptional heart care, we have created designated Provider Care Teams.  These Care Teams include your primary Cardiologist (physician) and Advanced Practice Providers (APPs -  Physician Assistants and Nurse Practitioners) who all work together to provide you with the care you need, when you need it.  We recommend signing up for the patient portal called "MyChart".  Sign up information is provided on this After Visit Summary.  MyChart is used to connect with patients for Virtual Visits (Telemedicine).  Patients are able to view lab/test results, encounter notes, upcoming appointments, etc.  Non-urgent messages can be sent to your provider as well.   To learn more about what you can do with MyChart, go to [email or web address].    Your next appointment:   6 month(s)  The format for your next appointment:   In Person  Provider:   Berniece Salines, DO   Other Instructions

## 2019-11-28 ENCOUNTER — Telehealth: Payer: Self-pay

## 2019-11-28 NOTE — Telephone Encounter (Signed)
Left a voice message for a pt regarding increasing her lipitor to 40 mg daily based on her labs from PCP.

## 2019-12-19 ENCOUNTER — Other Ambulatory Visit: Payer: Self-pay | Admitting: Cardiology

## 2020-04-17 DIAGNOSIS — E240 Pituitary-dependent Cushing's disease: Secondary | ICD-10-CM | POA: Insufficient documentation

## 2020-04-17 DIAGNOSIS — R011 Cardiac murmur, unspecified: Secondary | ICD-10-CM | POA: Insufficient documentation

## 2020-04-17 DIAGNOSIS — Z9889 Other specified postprocedural states: Secondary | ICD-10-CM | POA: Insufficient documentation

## 2020-04-17 DIAGNOSIS — T7840XA Allergy, unspecified, initial encounter: Secondary | ICD-10-CM | POA: Insufficient documentation

## 2020-04-17 DIAGNOSIS — M8580 Other specified disorders of bone density and structure, unspecified site: Secondary | ICD-10-CM | POA: Insufficient documentation

## 2020-04-17 DIAGNOSIS — R112 Nausea with vomiting, unspecified: Secondary | ICD-10-CM | POA: Insufficient documentation

## 2020-04-17 DIAGNOSIS — I341 Nonrheumatic mitral (valve) prolapse: Secondary | ICD-10-CM | POA: Insufficient documentation

## 2020-04-17 DIAGNOSIS — R519 Headache, unspecified: Secondary | ICD-10-CM | POA: Insufficient documentation

## 2020-04-17 DIAGNOSIS — F419 Anxiety disorder, unspecified: Secondary | ICD-10-CM | POA: Insufficient documentation

## 2020-04-17 DIAGNOSIS — K2970 Gastritis, unspecified, without bleeding: Secondary | ICD-10-CM | POA: Insufficient documentation

## 2020-04-17 DIAGNOSIS — N200 Calculus of kidney: Secondary | ICD-10-CM | POA: Insufficient documentation

## 2020-04-17 DIAGNOSIS — E785 Hyperlipidemia, unspecified: Secondary | ICD-10-CM | POA: Insufficient documentation

## 2020-04-17 DIAGNOSIS — K589 Irritable bowel syndrome without diarrhea: Secondary | ICD-10-CM | POA: Insufficient documentation

## 2020-04-17 DIAGNOSIS — K219 Gastro-esophageal reflux disease without esophagitis: Secondary | ICD-10-CM | POA: Insufficient documentation

## 2020-04-17 DIAGNOSIS — T8859XA Other complications of anesthesia, initial encounter: Secondary | ICD-10-CM | POA: Insufficient documentation

## 2020-04-17 DIAGNOSIS — G43909 Migraine, unspecified, not intractable, without status migrainosus: Secondary | ICD-10-CM | POA: Insufficient documentation

## 2020-04-20 ENCOUNTER — Other Ambulatory Visit: Payer: Self-pay

## 2020-04-20 ENCOUNTER — Encounter: Payer: Self-pay | Admitting: Cardiology

## 2020-04-20 ENCOUNTER — Ambulatory Visit: Payer: 59 | Admitting: Cardiology

## 2020-04-20 VITALS — BP 122/88 | HR 60 | Ht 68.0 in | Wt 148.0 lb

## 2020-04-20 DIAGNOSIS — M542 Cervicalgia: Secondary | ICD-10-CM | POA: Diagnosis not present

## 2020-04-20 DIAGNOSIS — E7800 Pure hypercholesterolemia, unspecified: Secondary | ICD-10-CM | POA: Insufficient documentation

## 2020-04-20 DIAGNOSIS — E249 Cushing's syndrome, unspecified: Secondary | ICD-10-CM | POA: Insufficient documentation

## 2020-04-20 DIAGNOSIS — E559 Vitamin D deficiency, unspecified: Secondary | ICD-10-CM | POA: Insufficient documentation

## 2020-04-20 DIAGNOSIS — I498 Other specified cardiac arrhythmias: Secondary | ICD-10-CM

## 2020-04-20 DIAGNOSIS — F39 Unspecified mood [affective] disorder: Secondary | ICD-10-CM | POA: Insufficient documentation

## 2020-04-20 DIAGNOSIS — M9390 Osteochondropathy, unspecified of unspecified site: Secondary | ICD-10-CM | POA: Insufficient documentation

## 2020-04-20 DIAGNOSIS — E782 Mixed hyperlipidemia: Secondary | ICD-10-CM | POA: Diagnosis not present

## 2020-04-20 DIAGNOSIS — E039 Hypothyroidism, unspecified: Secondary | ICD-10-CM | POA: Insufficient documentation

## 2020-04-20 DIAGNOSIS — I493 Ventricular premature depolarization: Secondary | ICD-10-CM | POA: Diagnosis not present

## 2020-04-20 NOTE — Progress Notes (Signed)
Cardiology Office Note:    Date:  04/20/2020   ID:  Margaret Carney, DOB Oct 30, 1960, MRN [id number]  PCP:  Kelton Pillar, MD  Cardiologist:  Berniece Salines, DO  Electrophysiologist:  None   Referring MD: Kelton Pillar, MD   I am doing fine, recently had some swelling in my neck  History of Present Illness:    Margaret Carney is a 60 y.o. female with a hx of symptomatic PVCs, hypertension is here today for follow-up visit.  I last saw the patient September 2021 at that time she appeared to be doing well from a cardiovascular standpoint but did have some tingling in her extremities.  She did follow-up with her PCP who did blood work on her and showed that the she did not have any diabetes.  Recently she tell me that about 2 to 3 months ago she experienced some leg pain but then eventually resolved.  She is not experiencing this today.  She does admits to some varicose vein and has it in all of extremities.  At her most certainly issue is the fact that she is experiencing some pain in her neck but does not really interfere when she swallows.  She says that she had a recent viral infection and anytime she has this she experienced this swelling but this is lasting way too long and more painful.  Past Medical History:  Diagnosis Date  . Allergy    seasonal  . Anxiety   . Chest pain of uncertain etiology 83/66/2947  . Chronic migraine 04/03/2013  . Complication of anesthesia   . Cushing's disease ([place])   . Diverticulosis 2013  . GERD (gastroesophageal reflux disease)   . Headache    migraines  . Heart murmur   . Hx of colonic polyps 2013   Tubular Adenoma 2 mm rectal  . Hyperlipidemia   . Hypertension 12/11/2018  . IBS (irritable bowel syndrome)   . Internal and external hemorrhoids without complication 6546  . Kidney stone on right side   . Migraines   . MVP (mitral valve prolapse)   . Osteopenia   . Palpitations 12/11/2018  . PONV (postoperative nausea and vomiting)    needs  Scoploamine patch and meds. to prevent nausea- had surgery 05/30/2014 and was given these  . Pre-procedure lab exam 12/11/2018  . Proctalgia fugax   . Sinus arrhythmia 12/11/2018    Past Surgical History:  Procedure Laterality Date  . BREAST SURGERY     right breast biopsy-benign  . COLONOSCOPY W/ BIOPSIES  2013  . CYSTOSCOPY Left 05/30/2014   Procedure: CYSTOSCOPY/LEFT RETROGRADE PYELOGRAM/PLACEMENT URETERAL STENT;  Surgeon: Alexis Frock, MD;  Location: [facility];  Service: Urology;  Laterality: Left;  . CYSTOSCOPY/URETEROSCOPY/HOLMIUM LASER/STENT PLACEMENT Left 06/24/2014   Procedure: CYSTOSCOPY, BILATERAL URETEROSCOPY, BILATERAL STENT PLACEMENT;  Surgeon: Festus Aloe, MD;  Location: [facility];  Service: Urology;  Laterality: Left;  . ESOPHAGOGASTRODUODENOSCOPY    . HOLMIUM LASER APPLICATION Left 06/03/5463   Procedure: HOLMIUM LASER LITHOTRIPSY ;  Surgeon: Festus Aloe, MD;  Location: [facility];  Service: Urology;  Laterality: Left;  . LAPAROSCOPIC ENDOMETRIOSIS FULGURATION  1989  . LITHOTRIPSY     Nephrolithiasis  . pitituary tumor  1989  . TONSILLECTOMY AND ADENOIDECTOMY  1968    Current Medications: No outpatient medications have been marked as taking for the 04/20/20 encounter (Office Visit) with Berniece Salines, DO.   Current Facility-Administered Medications for the 04/20/20 encounter (Office Visit) with Berniece Salines, DO  Medication  . 0.9 %  sodium chloride infusion     Allergies:   Codeine   Social History   Socioeconomic History  . Marital status: Widowed    Spouse name: Not on file  . Number of children: 1  . Years of education: Not on file  . Highest education level: Not on file  Occupational History  . Occupation: Herbalist: [employer]  Tobacco Use  . Smoking status: Never Smoker  . Smokeless tobacco: Never Used  Vaping Use  . Vaping Use: Never used  Substance and Sexual Activity  . Alcohol use: No  . Drug use: No  . Sexual  activity: Never  Other Topics Concern  . Not on file  Social History Narrative   Widowed 2018  one son.   She works as a Government social research officer.   Highest level of education:  Two years of college   Social Determinants of Health   Financial Resource Strain: Not on file  Food Insecurity: Not on file  Transportation Needs: Not on file  Physical Activity: Not on file  Stress: Not on file  Social Connections: Not on file     Family History: The patient's family history includes Breast cancer in her sister; Colon polyps in her mother; Dementia in her mother; Diabetes in her father; Heart disease in her paternal grandmother; Hypertension in her mother. There is no history of Colon cancer, Stomach cancer, Ulcerative colitis, Esophageal cancer, or Rectal cancer.  ROS:   Review of Systems  Constitution: Negative for decreased appetite, fever and weight gain.  HENT: Negative for congestion, ear discharge, hoarse voice and sore throat.   Eyes: Negative for discharge, redness, vision loss in right eye and visual halos.  Cardiovascular: Negative for chest pain, dyspnea on exertion, leg swelling, orthopnea and palpitations.  Respiratory: Negative for cough, hemoptysis, shortness of breath and snoring.   Endocrine: Negative for heat intolerance and polyphagia.  Hematologic/Lymphatic: Negative for bleeding problem. Does not bruise/bleed easily.  Skin: Negative for flushing, nail changes, rash and suspicious lesions.  Musculoskeletal: Negative for arthritis, joint pain, muscle cramps, myalgias, neck pain and stiffness.  Gastrointestinal: Negative for abdominal pain, bowel incontinence, diarrhea and excessive appetite.  Genitourinary: Negative for decreased libido, genital sores and incomplete emptying.  Neurological: Negative for brief paralysis, focal weakness, headaches and loss of balance.  Psychiatric/Behavioral: Negative for altered mental status, depression and suicidal ideas.   Allergic/Immunologic: Negative for HIV exposure and persistent infections.    EKGs/Labs/Other Studies Reviewed:    The following studies were reviewed today:   EKG: None today.  The patient wore the monitor for 7 days 2 hours starting 12/11/2018. Indication: Palpitations The minimum heart rate was 45 bpm, maximum heart rate was 121 bpm, and average heart rate was 71 bpm. Predominant underlying rhythm was Sinus Rhythm. Premature atrial complexes were were rare (<1.0%). Premature Ventricular complexes were rare (<1.0%). Ventricular Bigeminy was present. No ventricular tachycardia, No pauses, No AV block, No supraventricular tachycardia and no atrial fibrillation present. 15 patient triggered events: 7 was associated with premature ventricular complexes, 2 associated with supraventricular tachycardia and the remaining is associated sinus rhythm.  Conclusion: This study is remarkable for symptomatic premature ventricular complexes and symptomatic premature atrial complexes.   TTE Impression 12/21/18 IMPRESSIONS  1. Left ventricular ejection fraction, by visual estimation, is 60 to  65%. The left ventricle has normal function. There is no left ventricular  hypertrophy.  2. Global right ventricle has normal systolic function.The right  ventricular size is  normal. No increase in right ventricular wall  thickness.  3. Left atrial size was normal.  4. Right atrial size was normal.  5. The mitral valve is normal in structure. No evidence of mitral valve  regurgitation. No evidence of mitral stenosis.  6. The tricuspid valve is normal in structure. Tricuspid valve  regurgitation is trivial.  7. The aortic valve is normal in structure. Aortic valve regurgitation is  not visualized. No evidence of aortic valve sclerosis or stenosis.  8. The pulmonic valve was normal in structure. Pulmonic valve  regurgitation is not visualized.  9. Normal pulmonary artery systolic  pressure.  10. The inferior vena cava is normal in size with greater than 50%  respiratory variability, suggesting right atrial pressure of 3 mmHg.     Recent Labs: No results found for requested labs within last 8760 hours.  Recent Lipid Panel    Component Value Date/Time   CHOL (H) 09/23/2008 0905    205        ATP III CLASSIFICATION:  <200     mg/dL   Desirable  200-239  mg/dL   Borderline High  >=240    mg/dL   High          TRIG 35 09/23/2008 0905   HDL 60 09/23/2008 0905   CHOLHDL 3.4 09/23/2008 0905   VLDL 7 09/23/2008 0905   LDLCALC (H) 09/23/2008 0905    138        Total Cholesterol/HDL:CHD Risk Coronary Heart Disease Risk Table                     Men   Women  1/2 Average Risk   3.4   3.3  Average Risk       5.0   4.4  2 X Average Risk   9.6   7.1  3 X Average Risk  23.4   11.0        Use the calculated Patient Ratio above and the CHD Risk Table to determine the patient's CHD Risk.        ATP III CLASSIFICATION (LDL):  <100     mg/dL   Optimal  100-129  mg/dL   Near or Above                    Optimal  130-159  mg/dL   Borderline  160-189  mg/dL   High  >190     mg/dL   Very High    Physical Exam:    VS:  BP 122/88   Pulse 60   Ht 5\' 8"  (1.727 m)   Wt 148 lb (67.1 kg)   LMP 09/08/2010   SpO2 98%   BMI 22.50 kg/m     Wt Readings from Last 3 Encounters:  04/20/20 148 lb (67.1 kg)  10/09/19 144 lb 1.3 oz (65.4 kg)  03/28/19 146 lb 0.6 oz (66.2 kg)     GEN: Well nourished, well developed in no acute distress HEENT: Normal NECK: No JVD; No carotid bruits LYMPHATICS: No lymphadenopathy CARDIAC: S1S2 noted,RRR, no murmurs, rubs, gallops RESPIRATORY:  Clear to auscultation without rales, wheezing or rhonchi  ABDOMEN: Soft, non-tender, non-distended, +bowel sounds, no guarding. EXTREMITIES: No edema, No cyanosis, no clubbing MUSCULOSKELETAL:  No deformity  SKIN: Warm and dry NEUROLOGIC:  Alert and oriented x 3, non-focal PSYCHIATRIC:  Normal  affect, good insight  ASSESSMENT:    1. Symptomatic PVCs   2. Sinus arrhythmia   3. Mixed  hyperlipidemia   4. Neck pain    PLAN:     From a cardiovascular standpoint her symptomatic PVCs has improved on her beta-blocker she will continue this.  Her blood pressure is acceptable.  Also would appreciate physical exam her varicose veins and we talked about multiple avenues she had consideration for been evaluated by vascular surgery but for now she likes to hold up on any further evaluation.  Her physical exam does show evidence of swelling under her right jaw area and I have recommended the patient see her PCP if this persists.  She may need an ultrasound of her neck.  The patient is in agreement with the above plan. The patient left the office in stable condition.  The patient will follow up in 1 year or sooner if needed.   Medication Adjustments/Labs and Tests Ordered: Current medicines are reviewed at length with the patient today.  Concerns regarding medicines are outlined above.  No orders of the defined types were placed in this encounter.  No orders of the defined types were placed in this encounter.   Patient Instructions  Medication Instructions:  Your physician recommends that you continue on your current medications as directed. Please refer to the Current Medication list given to you today.  *If you need a refill on your cardiac medications before your next appointment, please call your pharmacy*   Lab Work: none If you have labs (blood work) drawn today and your tests are completely normal, you will receive your results only by: Marland Kitchen MyChart Message (if you have MyChart) OR . A paper copy in the mail If you have any lab test that is abnormal or we need to change your treatment, we will call you to review the results.   Testing/Procedures: none   Follow-Up: At [facility], you and your health needs are our priority.  As part of our continuing mission to provide  you with exceptional heart care, we have created designated Provider Care Teams.  These Care Teams include your primary Cardiologist (physician) and Advanced Practice Providers (APPs -  Physician Assistants and Nurse Practitioners) who all work together to provide you with the care you need, when you need it.  We recommend signing up for the patient portal called "MyChart".  Sign up information is provided on this After Visit Summary.  MyChart is used to connect with patients for Virtual Visits (Telemedicine).  Patients are able to view lab/test results, encounter notes, upcoming appointments, etc.  Non-urgent messages can be sent to your provider as well.   To learn more about what you can do with MyChart, go to [email or web address].    Your next appointment:   1 year(s)  The format for your next appointment:   In Person  Provider:   Berniece Salines, DO   Other Instructions      Adopting a Healthy Lifestyle.  Know what a healthy weight is for you (roughly BMI <25) and aim to maintain this   Aim for 7+ servings of fruits and vegetables daily   65-80+ fluid ounces of water or unsweet tea for healthy kidneys   Limit to max 1 drink of alcohol per day; avoid smoking/tobacco   Limit animal fats in diet for cholesterol and heart health - choose grass fed whenever available   Avoid highly processed foods, and foods high in saturated/trans fats   Aim for low stress - take time to unwind and care for your mental health   Aim for 150 min of moderate  intensity exercise weekly for heart health, and weights twice weekly for bone health   Aim for 7-9 hours of sleep daily   When it comes to diets, agreement about the perfect plan isnt easy to find, even among the experts. Experts at the [employer] developed an idea known as the Healthy Eating Plate. Just imagine a plate divided into logical, healthy portions.   The emphasis is on diet quality:   Load up on  vegetables and fruits - one-half of your plate: Aim for color and variety, and remember that potatoes dont count.   Go for whole grains - one-quarter of your plate: Whole wheat, barley, wheat berries, quinoa, oats, brown rice, and foods made with them. If you want pasta, go with whole wheat pasta.   Protein power - one-quarter of your plate: Fish, chicken, beans, and nuts are all healthy, versatile protein sources. Limit red meat.   The diet, however, does go beyond the plate, offering a few other suggestions.   Use healthy plant oils, such as olive, canola, soy, corn, sunflower and peanut. Check the labels, and avoid partially hydrogenated oil, which have unhealthy trans fats.   If youre thirsty, drink water. Coffee and tea are good in moderation, but skip sugary drinks and limit milk and dairy products to one or two daily servings.   The type of carbohydrate in the diet is more important than the amount. Some sources of carbohydrates, such as vegetables, fruits, whole grains, and beans-are healthier than others.   Finally, stay active  Signed, Berniece Salines, DO  04/20/2020 5:01 PM    [facility]

## 2020-04-20 NOTE — Patient Instructions (Signed)
Medication Instructions:  Your physician recommends that you continue on your current medications as directed. Please refer to the Current Medication list given to you today.  *If you need a refill on your cardiac medications before your next appointment, please call your pharmacy*   Lab Work: none If you have labs (blood work) drawn today and your tests are completely normal, you will receive your results only by: Marland Kitchen MyChart Message (if you have MyChart) OR . A paper copy in the mail If you have any lab test that is abnormal or we need to change your treatment, we will call you to review the results.   Testing/Procedures: none   Follow-Up: At [facility], you and your health needs are our priority.  As part of our continuing mission to provide you with exceptional heart care, we have created designated Provider Care Teams.  These Care Teams include your primary Cardiologist (physician) and Advanced Practice Providers (APPs -  Physician Assistants and Nurse Practitioners) who all work together to provide you with the care you need, when you need it.  We recommend signing up for the patient portal called "MyChart".  Sign up information is provided on this After Visit Summary.  MyChart is used to connect with patients for Virtual Visits (Telemedicine).  Patients are able to view lab/test results, encounter notes, upcoming appointments, etc.  Non-urgent messages can be sent to your provider as well.   To learn more about what you can do with MyChart, go to [email or web address].    Your next appointment:   1 year(s)  The format for your next appointment:   In Person  Provider:   Berniece Salines, DO   Other Instructions

## 2020-05-22 ENCOUNTER — Other Ambulatory Visit: Payer: Self-pay

## 2020-05-22 ENCOUNTER — Emergency Department (HOSPITAL_BASED_OUTPATIENT_CLINIC_OR_DEPARTMENT_OTHER): Payer: 59

## 2020-05-22 ENCOUNTER — Other Ambulatory Visit (HOSPITAL_BASED_OUTPATIENT_CLINIC_OR_DEPARTMENT_OTHER): Payer: Self-pay

## 2020-05-22 ENCOUNTER — Emergency Department (HOSPITAL_BASED_OUTPATIENT_CLINIC_OR_DEPARTMENT_OTHER)
Admission: EM | Admit: 2020-05-22 | Discharge: 2020-05-22 | Disposition: A | Discharge status: Home / Self Care | Payer: 59 | Attending: Emergency Medicine | Admitting: Emergency Medicine

## 2020-05-22 DIAGNOSIS — I10 Essential (primary) hypertension: Secondary | ICD-10-CM | POA: Diagnosis not present

## 2020-05-22 DIAGNOSIS — R202 Paresthesia of skin: Secondary | ICD-10-CM | POA: Insufficient documentation

## 2020-05-22 DIAGNOSIS — Z79899 Other long term (current) drug therapy: Secondary | ICD-10-CM | POA: Insufficient documentation

## 2020-05-22 DIAGNOSIS — R42 Dizziness and giddiness: Secondary | ICD-10-CM | POA: Insufficient documentation

## 2020-05-22 DIAGNOSIS — E039 Hypothyroidism, unspecified: Secondary | ICD-10-CM | POA: Insufficient documentation

## 2020-05-22 DIAGNOSIS — R112 Nausea with vomiting, unspecified: Secondary | ICD-10-CM | POA: Diagnosis present

## 2020-05-22 DIAGNOSIS — R197 Diarrhea, unspecified: Secondary | ICD-10-CM | POA: Insufficient documentation

## 2020-05-22 LAB — COMPREHENSIVE METABOLIC PANEL
ALT: 18 U/L (ref 0–44)
AST: 21 U/L (ref 15–41)
Albumin: 4.2 g/dL (ref 3.5–5.0)
Alkaline Phosphatase: 68 U/L (ref 38–126)
Anion gap: 10 (ref 5–15)
BUN: 18 mg/dL (ref 6–20)
CO2: 23 mmol/L (ref 22–32)
Calcium: 9.2 mg/dL (ref 8.9–10.3)
Chloride: 104 mmol/L (ref 98–111)
Creatinine, Ser: 0.82 mg/dL (ref 0.44–1.00)
GFR, Estimated: >60 mL/min (ref >60)
Glucose, Bld: 96 mg/dL (ref 70–99)
Potassium: 3.9 mmol/L (ref 3.5–5.1)
Sodium: 137 mmol/L (ref 135–145)
Total Bilirubin: 0.4 mg/dL (ref 0.3–1.2)
Total Protein: 7.6 g/dL (ref 6.5–8.1)

## 2020-05-22 LAB — URINALYSIS, MICROSCOPIC (REFLEX)

## 2020-05-22 LAB — URINALYSIS, ROUTINE W REFLEX MICROSCOPIC
Bilirubin Urine: NEGATIVE
Glucose, UA: NEGATIVE mg/dL
Ketones, ur: NEGATIVE mg/dL
Leukocytes,Ua: NEGATIVE
Nitrite: NEGATIVE
Protein, ur: NEGATIVE mg/dL
Specific Gravity, Urine: 1.01 (ref 1.005–1.030)
pH: 6 (ref 5.0–8.0)

## 2020-05-22 LAB — CBC
HCT: 44.6 % (ref 36.0–46.0)
Hemoglobin: 15.1 g/dL — ABNORMAL HIGH (ref 12.0–15.0)
MCH: 31.9 pg (ref 26.0–34.0)
MCHC: 33.9 g/dL (ref 30.0–36.0)
MCV: 94.3 fL (ref 80.0–100.0)
Platelets: 227 10*3/uL (ref 150–400)
RBC: 4.73 MIL/uL (ref 3.87–5.11)
RDW: 13.3 % (ref 11.5–15.5)
WBC: 9.2 10*3/uL (ref 4.0–10.5)
nRBC: 0 % (ref 0.0–0.2)

## 2020-05-22 LAB — LIPASE, BLOOD: Lipase: 30 U/L (ref 11–51)

## 2020-05-22 MED ORDER — SODIUM CHLORIDE 0.9 % IV BOLUS (SEPSIS)
1000.0000 mL | Freq: Once | INTRAVENOUS | Status: AC
  Administered 2020-05-22: 1000 mL via INTRAVENOUS

## 2020-05-22 MED ORDER — SODIUM CHLORIDE 0.9 % IV SOLN
1000.0000 mL | INTRAVENOUS | Status: DC
  Administered 2020-05-22: 1000 mL via INTRAVENOUS

## 2020-05-22 MED ORDER — ONDANSETRON HCL 4 MG/2ML IJ SOLN
4.0000 mg | Freq: Once | INTRAMUSCULAR | Status: AC
  Administered 2020-05-22: 4 mg via INTRAVENOUS
  Filled 2020-05-22: qty 2

## 2020-05-22 MED ORDER — ONDANSETRON 8 MG PO TBDP
8.0000 mg | ORAL_TABLET | Freq: Three times a day (TID) | ORAL | 0 refills | Status: DC | PRN
  Filled 2020-05-22: qty 20, 7d supply, fill #0

## 2020-05-22 NOTE — ED Triage Notes (Signed)
Presents with nausea and vomiting.

## 2020-05-22 NOTE — ED Notes (Signed)
ED Provider at bedside. 

## 2020-05-22 NOTE — ED Provider Notes (Signed)
[facility] Provider Note   CSN: [id number] Arrival date & time: 05/22/20  1332     History Chief Complaint  Patient presents with  . Nausea    [facility] is a 60 y.o. female.  HPI   Patient presented to the ED for evaluation of nausea and vomiting.  Patient states she started having several episodes since yesterday.  She has had numerous episodes of nausea and vomiting.  She has had a few episodes of loose stools but primarily has had vomiting.  Patient started to feel lightheaded.  She feels some tingling in her hands.  She feels like she might pass out when she stands.  She denies any abdominal pain.  No fevers.  She does have history of kidney stones with back discomfort but is not having any significant pain in her back.  She denies any hematuria or dysuria.  Past Medical History:  Diagnosis Date  . Allergy    seasonal  . Anxiety   . Chest pain of uncertain etiology 99991111  . Chronic migraine 04/03/2013  . Complication of anesthesia   . Cushing's disease ([place])   . Diverticulosis 2013  . GERD (gastroesophageal reflux disease)   . Headache    migraines  . Heart murmur   . Hx of colonic polyps 2013   Tubular Adenoma 2 mm rectal  . Hyperlipidemia   . Hypertension 12/11/2018  . IBS (irritable bowel syndrome)   . Internal and external hemorrhoids without complication 0000000  . Kidney stone on right side   . Migraines   . MVP (mitral valve prolapse)   . Osteopenia   . Palpitations 12/11/2018  . PONV (postoperative nausea and vomiting)    needs Scoploamine patch and meds. to prevent nausea- had surgery 05/30/2014 and was given these  . Pre-procedure lab exam 12/11/2018  . Proctalgia fugax   . Sinus arrhythmia 12/11/2018    Patient Active Problem List   Diagnosis Date Noted  . 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 Cushing's syndrome ([place]) 04/20/2020  . Hypothyroidism 04/20/2020  . Osteochondropathy 04/20/2020  . Pure  hypercholesterolemia 04/20/2020  . Vitamin D deficiency 04/20/2020  . Neck pain 04/20/2020  . 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 PONV (postoperative nausea and vomiting)   . MVP (mitral valve prolapse)   . Osteopenia   . Migraines   . Kidney stone on right side   . Irritable bowel syndrome   . Hyperlipidemia   . Heart murmur   . Headache   . Gastritis   . Cushing's disease ([place])   . Complication of anesthesia   . Anxiety   . Allergy   . Chest pain of uncertain etiology [id number]  . Pre-procedure lab exam 12/11/2018  . Palpitations 12/11/2018  . Hypertension 12/11/2018  . Sinus arrhythmia 12/11/2018  . 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 Hemorrhoids, internal, with bleeding 11/29/2013  . Migraine 04/03/2013  . History of colonic polyps 05/26/2011  . Internal and external hemorrhoids without complication 0000000  . Hx of colonic polyps 2013  . Diverticulosis 2013    Past Surgical History:  Procedure Laterality Date  . BREAST SURGERY     right breast biopsy-benign  . COLONOSCOPY W/ BIOPSIES  2013  . CYSTOSCOPY Left 05/30/2014   Procedure: CYSTOSCOPY/LEFT RETROGRADE PYELOGRAM/PLACEMENT URETERAL STENT;  Surgeon: Alexis Frock, MD;  Location: [facility];  Service: Urology;  Laterality: Left;  . CYSTOSCOPY/URETEROSCOPY/HOLMIUM LASER/STENT PLACEMENT Left 06/24/2014   Procedure: CYSTOSCOPY, BILATERAL URETEROSCOPY, BILATERAL STENT PLACEMENT;  Surgeon: Rodman Key  Junious Silk, MD;  Location: [facility];  Service: Urology;  Laterality: Left;  . ESOPHAGOGASTRODUODENOSCOPY    . HOLMIUM LASER APPLICATION Left 05/25/9561   Procedure: HOLMIUM LASER LITHOTRIPSY ;  Surgeon: Festus Aloe, MD;  Location: [facility];  Service: Urology;  Laterality: Left;  . LAPAROSCOPIC ENDOMETRIOSIS FULGURATION  1989  . LITHOTRIPSY     Nephrolithiasis  . pitituary tumor  1989  . TONSILLECTOMY AND ADENOIDECTOMY  1968     OB History   No obstetric history on file.     Family History  Problem Relation Age of Onset  . Colon  polyps Mother   . Hypertension Mother        Living, 81  . Dementia Mother   . Breast cancer Sister   . Diabetes Father        Living, 47  . Heart disease Paternal Grandmother   . Colon cancer Neg Hx   . Stomach cancer Neg Hx   . Ulcerative colitis Neg Hx   . Esophageal cancer Neg Hx   . Rectal cancer Neg Hx     Social History   Tobacco Use  . Smoking status: Never Smoker  . Smokeless tobacco: Never Used  Vaping Use  . Vaping Use: Never used  Substance Use Topics  . Alcohol use: No  . Drug use: No    Home Medications Prior to Admission medications   Medication Sig Start Date End Date Taking? Authorizing Provider  ondansetron (ZOFRAN ODT) 8 MG disintegrating tablet Take 1 tablet (8 mg total) by mouth every 8 (eight) hours as needed for nausea or vomiting. 05/22/20  Yes Dorie Rank, MD  aspirin-acetaminophen-caffeine (EXCEDRIN MIGRAINE) [phone] MG per tablet Take 1 tablet by mouth every 8 (eight) hours as needed for headache.    [provider]  atorvastatin (LIPITOR) 20 MG tablet Take 20 mg by mouth daily.  11/07/18   [provider]  metoprolol succinate (TOPROL-XL) 25 MG 24 hr tablet TAKE 1/2 TABLET(12.5 MG) BY MOUTH DAILY. 12/19/19   Tobb, Godfrey Pick, DO  Multiple Vitamin (MULTIVITAMIN WITH MINERALS) TABS tablet Take 1 tablet by mouth at bedtime.    [provider]  omeprazole (PRILOSEC) 40 MG capsule Take 40 mg by mouth as needed.    [provider]  Polyethyl Glycol-Propyl Glycol (SYSTANE OP) Apply 1-2 drops to eye daily as needed (dry eyes.).    [provider]  sertraline (ZOLOFT) 50 MG tablet Take 25 mg by mouth daily. 08/15/19   [provider]  SUMAtriptan (IMITREX) 100 MG tablet Take 100 mg by mouth daily as needed. 07/22/19   [provider]    Allergies    Codeine  Review of Systems   Review of Systems  All other systems reviewed and are negative.   Physical Exam Updated Vital Signs BP 120/79    Pulse 67   Temp 98.4 F (36.9 C) (Oral)   Resp 16   Ht 1.727 m (5\' 8" )   Wt 67.8 kg   LMP 09/08/2010   SpO2 100%   BMI 22.73 kg/m   Physical Exam Vitals and nursing note reviewed.  Constitutional:      General: She is not in acute distress.    Appearance: She is well-developed.  HENT:     Head: Normocephalic and atraumatic.     Right Ear: External ear normal.     Left Ear: External ear normal.  Eyes:     General: No scleral icterus.       Right  eye: No discharge.        Left eye: No discharge.     Conjunctiva/sclera: Conjunctivae normal.  Neck:     Trachea: No tracheal deviation.  Cardiovascular:     Rate and Rhythm: Normal rate and regular rhythm.  Pulmonary:     Effort: Pulmonary effort is normal. No respiratory distress.     Breath sounds: Normal breath sounds. No stridor. No wheezing or rales.  Abdominal:     General: Bowel sounds are normal. There is no distension.     Palpations: Abdomen is soft.     Tenderness: There is no abdominal tenderness. There is no guarding or rebound.  Musculoskeletal:        General: No tenderness.     Cervical back: Neck supple.  Skin:    General: Skin is warm and dry.     Findings: No rash.  Neurological:     Mental Status: She is alert.     Cranial Nerves: No cranial nerve deficit (no facial droop, extraocular movements intact, no slurred speech).     Sensory: No sensory deficit.     Motor: No abnormal muscle tone or seizure activity.     Coordination: Coordination normal.     ED Results / Procedures / Treatments   Labs (all labs ordered are listed, but only abnormal results are displayed) Labs Reviewed  CBC - Abnormal; Notable for the following components:      Result Value   Hemoglobin 15.1 (*)    All other components within normal limits  COMPREHENSIVE METABOLIC PANEL  LIPASE, BLOOD  URINALYSIS, ROUTINE W REFLEX MICROSCOPIC    EKG None  Radiology DG Abdomen Acute W/Chest  Result Date: 05/22/2020 CLINICAL  DATA:  Nausea and vomiting with mild diarrhea. EXAM: DG ABDOMEN ACUTE WITH 1 VIEW CHEST COMPARISON:  Radiographs 12/30/2016.  Abdominopelvic CT 11/18/2016. FINDINGS: The heart size and mediastinal contours are normal. The lungs appear clear. There is no pleural effusion or pneumothorax. The bowel gas pattern is normal, without wall thickening or free air. There are no suspicious abdominal calcifications. Mild degenerative changes are present in the lower lumbar spine. IMPRESSION: No radiographic evidence of active cardiopulmonary or abdominal process. Electronically Signed   By: Richardean Sale M.D.   On: 05/22/2020 15:03    Procedures Procedures   Medications Ordered in ED Medications  sodium chloride 0.9 % bolus 1,000 mL (has no administration in time range)    Followed by  0.9 %  sodium chloride infusion (has no administration in time range)  sodium chloride 0.9 % bolus 1,000 mL (0 mLs Intravenous Stopped 05/22/20 1512)  ondansetron (ZOFRAN) injection 4 mg (4 mg Intravenous Given 05/22/20 1407)    ED Course  I have reviewed the triage vital signs and the nursing notes.  Pertinent labs & imaging results that were available during my care of the patient were reviewed by me and considered in my medical decision making (see chart for details).  Clinical Course as of 05/22/20 1513  Fri May 22, 2020  1506 X-ray without signs of obstruction [JK]  [id number] CBC and metabolic panel are normal.  Lipase is normal [JK]  1511 Patient feeling better after IV fluids and antinausea medication.  She is drinking ginger ale [JK]    Clinical Course User Index [JK] Dorie Rank, MD   MDM Rules/Calculators/A&P                          Patient presented  to ED for evaluation of vomiting and nausea as well as some diarrhea.  Patient did not have any abdominal tenderness.  I doubt cholecystitis colitis, diverticulitis.  I was concerned about a possible bowel obstruction but abdominal x-rays do not show any evidence  of obstruction.  Patient improved with IV fluids.  She was also given Zofran.  Patient is feeling better and is able to tolerate liquids.  Will check urinalysis for signs of infection or hematuria.  Anticipate discharge home with antinausea medications Final Clinical Impression(s) / ED Diagnoses Final diagnoses:  Nausea and vomiting, intractability of vomiting not specified, unspecified vomiting type    Rx / DC Orders ED Discharge Orders         Ordered    ondansetron (ZOFRAN ODT) 8 MG disintegrating tablet  Every 8 hours PRN        05/22/20 1512           Dorie Rank, MD 05/22/20 1514

## 2020-05-22 NOTE — Discharge Instructions (Signed)
Take the medications as needed for nausea and vomiting.  Make sure to drink plenty fluids.  Slowly advance your diet as tolerated.  Follow-up with your doctor to be rechecked.  Return to the ED for worsening symptoms

## 2020-05-22 NOTE — ED Provider Notes (Signed)
3:24 PM Care assumed from Dr. Tomi Bamberger.  At time of transfer of care, patient is waiting for urinalysis to look for UTI given the nausea, vomiting, fatigue, lightheadedness, and some flank/back discomfort.  If there is significant blood in the urine and she is still having flank pain, would consider stone study per previous team but if it is not indicative or suggestive of stone, anticipate discharge plus or minus antibiotics if there is infection.  Anticipate reassessment after urine  5:16 PM Urinalysis returned showing no evidence of nitrites or leukocytes.  Low suspicion for infection.  She does have some trace hemoglobin the urine but as we discussed it was less than she has had in the past and she is feeling better in regards to discomfort.  She agrees to hold on any CT stone study at this time.  Patient will follow up with PCP and use the nausea medication provided.  She no other questions or concerns understood return precautions.  She was discharged in good condition with improving symptoms   Clinical Impression: 1. Nausea and vomiting, intractability of vomiting not specified, unspecified vomiting type     Disposition: Discharge  Condition: Good  I have discussed the results, Dx and Tx plan with the pt(& family if present). He/she/they expressed understanding and agree(s) with the plan. Discharge instructions discussed at great length. Strict return precautions discussed and pt &/or family have verbalized understanding of the instructions. No further questions at time of discharge.    New Prescriptions   ONDANSETRON (ZOFRAN ODT) 8 MG DISINTEGRATING TABLET    Take 1 tablet (8 mg total) by mouth every 8 (eight) hours as needed for nausea or vomiting.    Follow Up: Kelton Pillar, MD [address] [place] [address] [phone]     [facility] [address] [id number] [place]  [place] [phone]        Annastyn Silvey, Gwenyth Allegra, MD 05/22/20 [phone]

## 2020-05-22 NOTE — ED Notes (Signed)
Presents with nausea and vomiting, mild diarrhea, denies abd pain. States does have a kidney stones.

## 2020-05-22 NOTE — ED Notes (Signed)
Patient transported to X-ray 

## 2020-09-08 ENCOUNTER — Ambulatory Visit: Payer: 59 | Admitting: Neurology

## 2020-09-08 ENCOUNTER — Encounter: Payer: Self-pay | Admitting: Neurology

## 2020-09-08 VITALS — BP 129/77 | HR 74 | Ht 68.0 in | Wt 148.0 lb

## 2020-09-08 DIAGNOSIS — R208 Other disturbances of skin sensation: Secondary | ICD-10-CM

## 2020-09-08 DIAGNOSIS — F419 Anxiety disorder, unspecified: Secondary | ICD-10-CM

## 2020-09-08 DIAGNOSIS — Z86018 Personal history of other benign neoplasm: Secondary | ICD-10-CM

## 2020-09-08 DIAGNOSIS — I498 Other specified cardiac arrhythmias: Secondary | ICD-10-CM | POA: Diagnosis not present

## 2020-09-08 DIAGNOSIS — G90A Postural orthostatic tachycardia syndrome (POTS): Secondary | ICD-10-CM

## 2020-09-08 DIAGNOSIS — G43019 Migraine without aura, intractable, without status migrainosus: Secondary | ICD-10-CM

## 2020-09-08 DIAGNOSIS — R55 Syncope and collapse: Secondary | ICD-10-CM

## 2020-09-08 DIAGNOSIS — R42 Dizziness and giddiness: Secondary | ICD-10-CM

## 2020-09-08 MED ORDER — EMGALITY 120 MG/ML ~~LOC~~ SOAJ: 1.1200 mL | SUBCUTANEOUS | 3 refills | Status: DC

## 2020-09-08 NOTE — Patient Instructions (Signed)
Orthostatic Hypotension Blood pressure is a measurement of how strongly, or weakly, your blood is pressing against the walls of your arteries. Orthostatic hypotension is a sudden drop in blood pressure that happens when you quickly change positions,such as when you get up from sitting or lying down. Arteries are blood vessels that carry blood from your heart throughout your body. When blood pressure is too low, you may not get enough blood to your brain or to the rest of your organs. This can cause weakness, light-headedness, rapid heartbeat, and fainting. This can last for just a few seconds or for up to a few minutes. Orthostatic hypotension is usually not a serious problem. However, if it happens frequently or gets worse, it may be a sign of somethingmore serious. What are the causes? This condition may be caused by: Sudden changes in posture, such as standing up quickly after you have been sitting or lying down. Blood loss. Loss of body fluids (dehydration). Heart problems. Hormone (endocrine) problems. Pregnancy. Severe infection. Lack of certain nutrients. Severe allergic reactions (anaphylaxis). Certain medicines, such as blood pressure medicine or medicines that make the body lose excess fluids (diuretics). Sometimes, this condition can be caused by not taking medicine as directed, such as taking too much of a certain medicine. What increases the risk? The following factors may make you more likely to develop this condition: Age. Risk increases as you get older. Conditions that affect the heart or the central nervous system. Taking certain medicines, such as blood pressure medicine or diuretics. Being pregnant. What are the signs or symptoms? Symptoms of this condition may include: Weakness. Light-headedness. Dizziness. Blurred vision. Fatigue. Rapid heartbeat. Fainting, in severe cases. How is this diagnosed? This condition is diagnosed based on: Your medical history. Your  symptoms. Your blood pressure measurement. Your health care provider will check your blood pressure when you are: Lying down. Sitting. Standing. A blood pressure reading is recorded as two numbers, such as "120 over 80" (or 120/80). The first ("top") number is called the systolic pressure. It is a measure of the pressure in your arteries as your heart beats. The second ("bottom") number is called the diastolic pressure. It is a measure of the pressure in your arteries when your heart relaxes between beats. Blood pressure is measured in a unit called mm Hg. Healthy blood pressure for most adults is 120/80. If your blood pressure is below 90/60, you may be diagnosed withhypotension. Other information or tests that may be used to diagnose orthostatic hypotension include: Your other vital signs, such as your heart rate and temperature. Blood tests. Tilt table test. For this test, you will be safely secured to a table that moves you from a lying position to an upright position. Your heart rhythm and blood pressure will be monitored during the test. How is this treated? This condition may be treated by: Changing your diet. This may involve eating more salt (sodium) or drinking more water. Taking medicines to raise your blood pressure. Changing the dosage of certain medicines you are taking that might be lowering your blood pressure. Wearing compression stockings. These stockings help to prevent blood clots and reduce swelling in your legs. In some cases, you may need to go to the hospital for: Fluid replacement. This means you will receive fluids through an IV. Blood replacement. This means you will receive donated blood through an IV (transfusion). Treating an infection or heart problems, if this applies. Monitoring. You may need to be monitored while medicines that you   are taking wear off. Follow these instructions at home: Eating and drinking  Drink enough fluid to keep your urine pale  yellow. Eat a healthy diet, and follow instructions from your health care provider about eating or drinking restrictions. A healthy diet includes: Fresh fruits and vegetables. Whole grains. Lean meats. Low-fat dairy products. Eat extra salt only as directed. Do not add extra salt to your diet unless your health care provider told you to do that. Eat frequent, small meals. Avoid standing up suddenly after eating.  Medicines Take over-the-counter and prescription medicines only as told by your health care provider. Follow instructions from your health care provider about changing the dosage of your current medicines, if this applies. Do not stop or adjust any of your medicines on your own. General instructions  Wear compression stockings as told by your health care provider. Get up slowly from lying down or sitting positions. This gives your blood pressure a chance to adjust. Avoid hot showers and excessive heat as directed by your health care provider. Return to your normal activities as told by your health care provider. Ask your health care provider what activities are safe for you. Do not use any products that contain nicotine or tobacco, such as cigarettes, e-cigarettes, and chewing tobacco. If you need help quitting, ask your health care provider. Keep all follow-up visits as told by your health care provider. This is important.  Contact a health care provider if you: Vomit. Have diarrhea. Have a fever for more than 2-3 days. Feel more thirsty than usual. Feel weak and tired. Get help right away if you: Have chest pain. Have a fast or irregular heartbeat. Develop numbness in any part of your body. Cannot move your arms or your legs. Have trouble speaking. Become sweaty or feel light-headed. Faint. Feel short of breath. Have trouble staying awake. Feel confused. Summary Orthostatic hypotension is a sudden drop in blood pressure that happens when you quickly change  positions. Orthostatic hypotension is usually not a serious problem. It is diagnosed by having your blood pressure taken lying down, sitting, and then standing. It may be treated by changing your diet or adjusting your medicines. This information is not intended to replace advice given to you by your health care provider. Make sure you discuss any questions you have with your healthcare provider. Document Revised: 07/13/2017 Document Reviewed: 07/13/2017 Elsevier Patient Education  2022 [employer]  

## 2020-09-08 NOTE — Progress Notes (Signed)
SLEEP MEDICINE CLINIC    Provider:  Larey Seat, MD  Primary Care Physician:  Kelton Pillar, MD [address] [place] [address]     Referring Provider: Kelton Pillar, Md [address] [place],  [place] [address]          Chief Complaint according to patient   Patient presents with:     New Patient (Initial Visit)     Pt alone, rm 10. Over the last yr around November developed dizziness and tingling in hands/feet which has steadily been consistent. Evaluated by cardiology. She has noted balance concerns and dizziness. Shooting pains started about 6 mths ago in the arms/legs. Pt states that she could be sitting or standing still and start to feel like falling over. It occurs intermittentand can't pin point anything causing.       Other Denies having imaging completed or nerve conduction studies         HISTORY OF PRESENT ILLNESS:  Margaret Carney is a 60 y.o. year old White or Caucasian female patient seen here as a referral on 09/08/2020 from PCP, and encouraged by cardiology .  Chief concern according to patient :   1) last November the patient had a spell of tachycardia while driving , racing heart, presented to ED.    2) She reports 6 months of shooting pains down he extremities- these are quick and quickly over but intense.  No activity precipitating these evenats, any day time of day.  3) Headaches - another issue, arising from occipital area, nape of the neck, and can be so intense it is leaving her nauseated. Photophobia.  4) unsteady at  times. Orthostatics will be taken.    I have the pleasure of seeing Margaret Carney .  She  has a past medical history of Allergy, Anxiety, Chest pain of uncertain etiology (99991111), Chronic migraine (A999333), Complication of anesthesia, Cushing's disease ([place]), Diverticulosis (2013), GERD (gastroesophageal reflux disease), Headache, Heart murmur, colonic polyps (2013), Hyperlipidemia,  Hypertension (12/11/2018), IBS (irritable bowel syndrome), Internal and external hemorrhoids without complication (0000000), Kidney stone on right side, Migraines, MVP (mitral valve prolapse), Osteopenia, Palpitations (12/11/2018), PONV (postoperative nausea and vomiting), Pre-procedure lab exam (12/11/2018), Proctalgia fugax, and Sinus arrhythmia (12/11/2018)..      Review of Systems: Out of a complete 14 system review, the patient complains of only the following symptoms, and all other reviewed systems are negative.:   The patient reports migrainous headaches followed by a nausea component of his photophobia.  I am not quite sure what the extremity shooting pains may be, but I am happy to investigate further her concerned about possible multiple sclerosis.  Her tingling is not consistently present it comes and goes so that would not be a typical MS manifestation at all.  Her lightheadedness seems to be related or possibly related to bradycardia, orthostatic changes and she has also reported that she feels a little bit off if she has to enter an escalator.  This is a common complaint with patients that feel just slightly delayed and there physical response received at this many patients that have a latent sensation of vertigo.  I do not see any abnormal eye movements.   How likely are you to doze in the following situations: 0 = not likely, 1 = slight chance, 2 = moderate chance, 3 = high chance   Sitting and Reading? Watching Television? Sitting inactive in a public place (theater or meeting)?  As a passenger in a car for an hour without a break? Lying down in the afternoon when circumstances permit? Sitting and talking to someone? Sitting quietly after lunch without alcohol? In a car, while stopped for a few minutes in traffic?   Total = 10/ 24 points   FSS endorsed at 42/ 63 points.   Social History   Socioeconomic History   Marital status: Widowed    Spouse name: Not on file   Number of  children: 1   Years of education: Not on file   Highest education level: Not on file  Occupational History   Occupation: Herbalist: [employer]  Tobacco Use   Smoking status: Never   Smokeless tobacco: Never  Vaping Use   Vaping Use: Never used  Substance and Sexual Activity   Alcohol use: No   Drug use: No   Sexual activity: Never  Other Topics Concern   Not on file  Social History Narrative   Widowed 2018  one son.   She works as a Government social research officer.   Highest level of education:  Two years of college   Social Determinants of Health   Financial Resource Strain: Not on file  Food Insecurity: Not on file  Transportation Needs: Not on file  Physical Activity: Not on file  Stress: Not on file  Social Connections: Not on file    Family History  Problem Relation Age of Onset   Colon polyps Mother    Hypertension Mother        Living, 63   Dementia Mother    Breast cancer Sister    Diabetes Father        Living, 16   Heart disease Paternal Grandmother    Colon cancer Neg Hx    Stomach cancer Neg Hx    Ulcerative colitis Neg Hx    Esophageal cancer Neg Hx    Rectal cancer Neg Hx     Past Medical History:  Diagnosis Date   Allergy    seasonal   Anxiety    Chest pain of uncertain etiology 99991111   Chronic migraine A999333   Complication of anesthesia    Cushing's disease ([place])    Diverticulosis 2013   GERD (gastroesophageal reflux disease)    Headache    migraines   Heart murmur    Hx of colonic polyps 2013   Tubular Adenoma 2 mm rectal   Hyperlipidemia    Hypertension 12/11/2018   IBS (irritable bowel syndrome)    Internal and external hemorrhoids without complication 0000000   Kidney stone on right side    Migraines    MVP (mitral valve prolapse)    Osteopenia    Palpitations 12/11/2018   PONV (postoperative nausea and vomiting)    needs Scoploamine patch and meds. to prevent nausea- had surgery 05/30/2014 and was given  these   Pre-procedure lab exam 12/11/2018   Proctalgia fugax    Sinus arrhythmia 12/11/2018    Past Surgical History:  Procedure Laterality Date   BREAST SURGERY     right breast biopsy-benign   COLONOSCOPY W/ BIOPSIES  2013   CYSTOSCOPY Left 05/30/2014   Procedure: CYSTOSCOPY/LEFT RETROGRADE PYELOGRAM/PLACEMENT URETERAL STENT;  Surgeon: Alexis Frock, MD;  Location: [facility];  Service: Urology;  Laterality: Left;   CYSTOSCOPY/URETEROSCOPY/HOLMIUM LASER/STENT PLACEMENT Left 06/24/2014   Procedure: CYSTOSCOPY, BILATERAL URETEROSCOPY, BILATERAL STENT PLACEMENT;  Surgeon: Festus Aloe, MD;  Location: [facility];  Service: Urology;  Laterality: Left;   ESOPHAGOGASTRODUODENOSCOPY  HOLMIUM LASER APPLICATION Left [id number]   Procedure: HOLMIUM LASER LITHOTRIPSY ;  Surgeon: Festus Aloe, MD;  Location: [facility];  Service: Urology;  Laterality: Left;   LAPAROSCOPIC ENDOMETRIOSIS FULGURATION  1989   LITHOTRIPSY     Nephrolithiasis   pitituary tumor  [place]     Current Outpatient Medications on File Prior to Visit  Medication Sig Dispense Refill   aspirin-acetaminophen-caffeine (EXCEDRIN MIGRAINE) 250-250-65 MG per tablet Take 1 tablet by mouth every 8 (eight) hours as needed for headache.     metoprolol succinate (TOPROL-XL) 25 MG 24 hr tablet TAKE 1/2 TABLET(12.5 MG) BY MOUTH DAILY. 45 tablet 2   omeprazole (PRILOSEC) 40 MG capsule Take 40 mg by mouth as needed.     ondansetron (ZOFRAN ODT) 8 MG disintegrating tablet Take 1 tablet (8 mg total) by mouth every 8 (eight) hours as needed for nausea or vomiting. 20 tablet 0   Polyethyl Glycol-Propyl Glycol (SYSTANE OP) Apply 1-2 drops to eye daily as needed (dry eyes.).     sertraline (ZOLOFT) 50 MG tablet Take 25 mg by mouth daily.     SUMAtriptan (IMITREX) 100 MG tablet Take 100 mg by mouth daily as needed.     atorvastatin (LIPITOR) 20 MG tablet Take 20 mg by mouth daily.  (Patient not taking: Reported  on 09/08/2020)     Multiple Vitamin (MULTIVITAMIN WITH MINERALS) TABS tablet Take 1 tablet by mouth at bedtime. (Patient not taking: Reported on 09/08/2020)     Current Facility-Administered Medications on File Prior to Visit  Medication Dose Route Frequency Provider Last Rate Last Admin   0.9 %  sodium chloride infusion  500 mL Intravenous Continuous Gatha Mayer, MD        Allergies  Allergen Reactions   Codeine Itching and Other (See Comments)    Physical exam:  Today's Vitals   09/08/20 1406  BP: 129/77  Pulse: 74  Weight: 148 lb (67.1 kg)  Height: '5\' 8"'$  (1.727 m)   Body mass index is 22.5 kg/m.   Wt Readings from Last 3 Encounters:  09/08/20 148 lb (67.1 kg)  05/22/20 149 lb 7.6 oz (67.8 kg)  04/20/20 148 lb (67.1 kg)     Ht Readings from Last 3 Encounters:  09/08/20 '5\' 8"'$  (1.727 m)  05/22/20 '5\' 8"'$  (1.727 m)  04/20/20 '5\' 8"'$  (1.727 m)    In a seated story in a supine position blood pressure was 129/77 mmHg with a heart rate regular at 74 bpm seated the patient presented with 121/73 mmHg blood pressure and a heart rate of 67 which actually should have risen instead of decreasing.  Standing her blood pressure dropped 216/69 not a significant decrease but her heart rate this time compensated at night 96 bpm she reportedly was swaying a little bit and felt lightheaded.  This would still be normal compensatory response to the lower blood pressure. General: The patient is awake, alert and appears not in acute distress. The patient is well groomed. Head: Normocephalic, atraumatic. Neck is supple. Mallampati 3,  neck circumference:15 inches . Nasal airflow  patent.  Retrognathia is not seen.  Dental status: intact  Cardiovascular:  Regular rate and cardiac rhythm by pulse,  without distended neck veins. Respiratory: Lungs are clear to auscultation.  Skin:  Without evidence of ankle edema, or rash. Trunk: The patient's posture is erect.   Neurologic exam : The patient is  awake and alert, oriented to place and time.  Memory subjective described as intact.  Attention span & concentration ability appears normal.  Speech is fluent,  without  dysarthria, dysphonia or aphasia.  Mood and affect are anxious. Very worried about her health.    Cranial nerves: no loss of smell or taste reported  Pupils are equal and briskly reactive to light. Funduscopic exam deferred.  Extraocular movements in vertical and horizontal planes were intact and without nystagmus. No Diplopia. Visual fields by finger perimetry are intact. Hearing was intact to tuning fork-  Facial sensation intact to fine touch.  Facial motor strength is symmetric and tongue and uvula move midline.  Neck ROM : rotation, tilt and flexion extension were normal for age and shoulder shrug was symmetrical.    Motor exam:  Symmetric bulk, tone and ROM.   Normal tone without cog- wheeling, symmetric grip strength .   Sensory:  Fine touch vibration were tested and normal, felt in both toes.   Proprioception tested in the upper extremities was normal.   Coordination: Rapid alternating movements in the fingers/hands were of normal speed.  The Finger-to-nose maneuver was with mild dysmetria on the right, no tremor. Left was fine.    Gait and station: Patient could rise unassisted from a seated position, walked without assistive device.  Stance is of normal width/ base and the patient turned with 3.5 steps. No drift, normal arm-swing, no tremor.  Toe and heel walk were deferred.   Deep tendon reflexes: in the  upper and lower extremities are symmetric and intact. 2 plus, healthy.  Babinski response was deferred .       After spending a total time of  40  minutes face to face and additional time for physical and neurologic examination, review of laboratory studies,  personal review of imaging studies, reports and results of other testing and review of referral information / records as far as provided in visit,  I have established the following assessments:   1) Symptomatic PVC, not today. Had bradycardia which could account for light headedness. Dr Godfrey Pick Tobb, DO.   mild orthostatic symptoms her today, see above.  Normal gait and DTRs.  2) No neuropathic findings.  Tingling in extremities, coming and going - not today . The shooting sensations, in can not explain.  3) Migraine component to headache.     My Plan is to proceed with:  1)needing hydration and regular exercise, cardio and core.  2)MRI brain to rule out demyelination, slight dysmetria 3) Imitrex prn can continue , but migraines present 5 days a month or more .  Started after menopause. Topiramate for prevention would cause some tingling. Beta blocker not indicated due to orthostatics.   Consider prevention by emgality. Patient had a pituitary tumor , surgical removed. MRi brain    I would like to thank Kelton Pillar, MD  [address] [place],  [place] [address] for allowing me to meet with and to take care of this pleasant patient.   In short, Margaret Carney is presenting with possible stress related somatization  symptoms.  I plan to follow up either personally or through our NP within 6 month.   CC: I will share my notes with PCP. Marland Kitchen  Electronically signed by: Larey Seat, MD 09/08/2020 2:21 PM  [facility] Board certified by The [employer] of Sleep Medicine and Diplomate of the [employer] of Sleep Medicine. Board certified In Neurology through the [place], Fellow of the [employer] of Neurology.  Medical Director of [facility].

## 2020-09-10 ENCOUNTER — Telehealth: Payer: Self-pay | Admitting: Neurology

## 2020-09-10 NOTE — Telephone Encounter (Signed)
LVM for pt to call back about scheduling mri EE  [facility], [place], reference [id number]

## 2020-09-14 NOTE — Telephone Encounter (Signed)
Patient left a voicemail on my phone.. I called her back and left her a voicemail to call me back to schedule her MRI.

## 2020-09-15 NOTE — Telephone Encounter (Signed)
Patient returned my call she is scheduled at [facility] for 09/22/20.

## 2020-09-21 ENCOUNTER — Telehealth: Payer: Self-pay | Admitting: Neurology

## 2020-09-21 MED ORDER — ALPRAZOLAM 1 MG PO TABS: 1.0000 mg | ORAL_TABLET | ORAL | 0 refills | Status: DC | PRN

## 2020-09-21 NOTE — Telephone Encounter (Signed)
Called pt. Advised I spoke w/ Dr. Brett Fairy. Approved sending in xanax for her to take prior to MRI. I went over instructions with her. She will have her sister drive her to and from the test.

## 2020-09-21 NOTE — Telephone Encounter (Signed)
Pt is asking if something can be called into [facility][id number] to calm her for the MRI tomorrow.

## 2020-09-21 NOTE — Addendum Note (Signed)
Addended byRonnald Ramp, Hy Swiatek L on: 09/21/2020 05:00 PM   Modules accepted: Orders

## 2020-09-21 NOTE — Telephone Encounter (Signed)
Requested pre MRI medication for anxiety.

## 2020-09-22 ENCOUNTER — Other Ambulatory Visit: Payer: Self-pay

## 2020-09-22 ENCOUNTER — Telehealth: Payer: Self-pay | Admitting: *Deleted

## 2020-09-22 ENCOUNTER — Ambulatory Visit: Payer: 59

## 2020-09-22 DIAGNOSIS — G43019 Migraine without aura, intractable, without status migrainosus: Secondary | ICD-10-CM | POA: Diagnosis not present

## 2020-09-22 DIAGNOSIS — Z86018 Personal history of other benign neoplasm: Secondary | ICD-10-CM | POA: Diagnosis not present

## 2020-09-22 DIAGNOSIS — R208 Other disturbances of skin sensation: Secondary | ICD-10-CM | POA: Diagnosis not present

## 2020-09-22 DIAGNOSIS — G90A Postural orthostatic tachycardia syndrome (POTS): Secondary | ICD-10-CM

## 2020-09-22 DIAGNOSIS — I498 Other specified cardiac arrhythmias: Secondary | ICD-10-CM

## 2020-09-22 DIAGNOSIS — R42 Dizziness and giddiness: Secondary | ICD-10-CM

## 2020-09-22 DIAGNOSIS — R55 Syncope and collapse: Secondary | ICD-10-CM

## 2020-09-22 MED ORDER — GADOBENATE DIMEGLUMINE 529 MG/ML IV SOLN
15.0000 mL | Freq: Once | INTRAVENOUS | Status: AC | PRN
  Administered 2020-09-22: 15 mL via INTRAVENOUS

## 2020-09-22 NOTE — Telephone Encounter (Signed)
Submitted PA Emgality on CMM. KeyLA:[id number]. Received instant approval: "[id number];Review Type:Prior Auth;Coverage Start Date:08/23/2020;Coverage End Date:09/22/2021;"

## 2020-09-30 NOTE — Progress Notes (Signed)
IMPRESSION: This MRI of the brain with and without contrast shows the following: 1.   The brain is normal for age with just a few punctate T2/FLAIR hyperintense foci in the hemispheres.  The pattern is stable compared to the 2015 MRI.  These are most consistent with minimal chronic microvascular ischemic change. 2.   Irregularity of the sella turcica consistent with prior pituitary surgery.  The pituitary gland is fairly symmetric and the pituitary tumor the infundibulum is midline.  No recurrence is noted. 3.   No acute findings.  Normal enhancement pattern.   INTERPRETING PHYSICIAN:  Richard A. Felecia Shelling, MD, PhD, FAAN Certified in  [place] by [employer] of Neuroimaging

## 2020-10-01 ENCOUNTER — Telehealth: Payer: Self-pay | Admitting: Neurology

## 2020-10-01 NOTE — Telephone Encounter (Signed)
Called the patient and reviewed the MRI results with her in detail. Advised there was nothing that appeared concerning. Pt verbalized understanding. She has an upcoming apt with Amy and advised in the meantime she can call if she has any questions or concerns.

## 2020-10-01 NOTE — Telephone Encounter (Signed)
-----   Message from Larey Seat, MD sent at 09/30/2020  5:32 PM EDT ----- IMPRESSION: This MRI of the brain with and without contrast shows the following: 1.   The brain is normal for age with just a few punctate T2/FLAIR hyperintense foci in the hemispheres.  The pattern is stable compared to the 2015 MRI.  These are most consistent with minimal chronic microvascular ischemic change. 2.   Irregularity of the sella turcica consistent with prior pituitary surgery.  The pituitary gland is fairly symmetric and the pituitary tumor the infundibulum is midline.  No recurrence is noted. 3.   No acute findings.  Normal enhancement pattern.   INTERPRETING PHYSICIAN:  Richard A. Felecia Shelling, MD, PhD, FAAN Certified in  [place] by [employer] of Neuroimaging

## 2020-12-01 ENCOUNTER — Ambulatory Visit: Payer: 59 | Admitting: Family Medicine

## 2020-12-01 ENCOUNTER — Other Ambulatory Visit: Payer: Self-pay

## 2020-12-01 ENCOUNTER — Encounter: Payer: Self-pay | Admitting: Family Medicine

## 2020-12-01 VITALS — BP 121/69 | HR 65 | Ht 68.0 in | Wt 152.0 lb

## 2020-12-01 DIAGNOSIS — G43009 Migraine without aura, not intractable, without status migrainosus: Secondary | ICD-10-CM

## 2020-12-01 NOTE — Patient Instructions (Signed)
Below is our plan:  We will continue sumatriptan as needed. Consider Roselyn Meier or Nurtec as needed if sumatriptan is no longer working.   Please make sure you are staying well hydrated. I recommend 50-60 ounces daily. Well balanced diet and regular exercise encouraged. Consistent sleep schedule with 6-8 hours recommended.   Please continue follow up with care team as directed.   Follow up with me in needed  You may receive a survey regarding today's visit. I encourage you to leave honest feed back as I do use this information to improve patient care. Thank you for seeing me today!

## 2020-12-01 NOTE — Progress Notes (Signed)
Chief Complaint  Patient presents with   Follow-up    Rm 10, alone. Pt here for migraine f/u.  Pt has not started [employer]. Pt reports doing well. Migraines are weekly and mainly stress related.  Continues to have shooting pn, like an Dealer surge.     HISTORY OF PRESENT ILLNESS:  12/01/20 ALL:  Margaret Carney is a 60 y.o. female here today for follow up for migraines. She was started on Emgality but did not start. She reports having 4-5 headache days a month with 1-2 being migrainous. Sumatriptan does help. She usually only has to take 1/2 tablet. It does make her a little sleepy. She does continues to have shooting or electrical sensations throughout her body. She feels short of breath and anxious at times. She endorses stress as most likely contributor. MRI was normal.   HISTORY (copied from Dr Dohmeier previous note)  Margaret Carney is a 60 y.o. year old White or Caucasian female patient seen here as a referral on 09/08/2020 from PCP, and encouraged by cardiology .  Chief concern according to patient :   1) last November the patient had a spell of tachycardia while driving , racing heart, presented to ED.    2) She reports 6 months of shooting pains down he extremities- these are quick and quickly over but intense.  No activity precipitating these evenats, any day time of day.   3) Headaches - another issue, arising from occipital area, nape of the neck, and can be so intense it is leaving her nauseated. Photophobia.   4) unsteady at  times. Orthostatics will be taken.    I have the pleasure of seeing Margaret Carney .  She  has a past medical history of Allergy, Anxiety, Chest pain of uncertain etiology (35/36/1443), Chronic migraine (02/04/4006), Complication of anesthesia, Cushing's disease ([place]), Diverticulosis (2013), GERD (gastroesophageal reflux disease), Headache, Heart murmur, colonic polyps (2013), Hyperlipidemia, Hypertension (12/11/2018), IBS (irritable bowel syndrome),  Internal and external hemorrhoids without complication (6761), Kidney stone on right side, Migraines, MVP (mitral valve prolapse), Osteopenia, Palpitations (12/11/2018), PONV (postoperative nausea and vomiting), Pre-procedure lab exam (12/11/2018), Proctalgia fugax, and Sinus arrhythmia (12/11/2018).   REVIEW OF SYSTEMS: Out of a complete 14 system review of symptoms, the patient complains only of the following symptoms, headaches, anxiety, electrical sensations and all other reviewed systems are negative.   ALLERGIES: Allergies  Allergen Reactions   Codeine Itching and Other (See Comments)     HOME MEDICATIONS: Outpatient Medications Prior to Visit  Medication Sig Dispense Refill   ALPRAZolam (XANAX) 1 MG tablet Take 1 tablet (1 mg total) by mouth as needed for anxiety (MRI claustrophobia). 2 tablet 0   aspirin-acetaminophen-caffeine (EXCEDRIN MIGRAINE) 950-932-67 MG per tablet Take 1 tablet by mouth every 8 (eight) hours as needed for headache.     EMGALITY 120 MG/ML SOAJ Inject 1.12 mLs into the skin every 30 (thirty) days. 1.12 mL 3   metoprolol succinate (TOPROL-XL) 25 MG 24 hr tablet TAKE 1/2 TABLET(12.5 MG) BY MOUTH DAILY. (Patient not taking: Reported on 12/01/2020) 45 tablet 2   omeprazole (PRILOSEC) 40 MG capsule Take 40 mg by mouth as needed.     ondansetron (ZOFRAN ODT) 8 MG disintegrating tablet Take 1 tablet (8 mg total) by mouth every 8 (eight) hours as needed for nausea or vomiting. 20 tablet 0   Polyethyl Glycol-Propyl Glycol (SYSTANE OP) Apply 1-2 drops to eye daily as needed (dry eyes.).     sertraline (  ZOLOFT) 50 MG tablet Take 25 mg by mouth daily.     SUMAtriptan (IMITREX) 100 MG tablet Take 100 mg by mouth daily as needed.     No facility-administered medications prior to visit.     PAST MEDICAL HISTORY: Past Medical History:  Diagnosis Date   Allergy    seasonal   Anxiety    Chest pain of uncertain etiology 26/71/2458   Chronic migraine 0/10/9831    Complication of anesthesia    Cushing's disease ([place])    Diverticulosis 2013   GERD (gastroesophageal reflux disease)    Headache    migraines   Heart murmur    Hx of colonic polyps 2013   Tubular Adenoma 2 mm rectal   Hyperlipidemia    Hypertension 12/11/2018   IBS (irritable bowel syndrome)    Internal and external hemorrhoids without complication 8250   Kidney stone on right side    Migraines    MVP (mitral valve prolapse)    Osteopenia    Palpitations 12/11/2018   PONV (postoperative nausea and vomiting)    needs Scoploamine patch and meds. to prevent nausea- had surgery 05/30/2014 and was given these   Pre-procedure lab exam 12/11/2018   Proctalgia fugax    Sinus arrhythmia 12/11/2018     PAST SURGICAL HISTORY: Past Surgical History:  Procedure Laterality Date   BREAST SURGERY     right breast biopsy-benign   COLONOSCOPY W/ BIOPSIES  2013   CYSTOSCOPY Left 05/30/2014   Procedure: CYSTOSCOPY/LEFT RETROGRADE PYELOGRAM/PLACEMENT URETERAL STENT;  Surgeon: Alexis Frock, MD;  Location: [facility];  Service: Urology;  Laterality: Left;   CYSTOSCOPY/URETEROSCOPY/HOLMIUM LASER/STENT PLACEMENT Left 06/24/2014   Procedure: CYSTOSCOPY, BILATERAL URETEROSCOPY, BILATERAL STENT PLACEMENT;  Surgeon: Festus Aloe, MD;  Location: [facility];  Service: Urology;  Laterality: Left;   ESOPHAGOGASTRODUODENOSCOPY     HOLMIUM LASER APPLICATION Left 5/39/7673   Procedure: HOLMIUM LASER LITHOTRIPSY ;  Surgeon: Festus Aloe, MD;  Location: [facility];  Service: Urology;  Laterality: Left;   LAPAROSCOPIC ENDOMETRIOSIS FULGURATION  1989   LITHOTRIPSY     Nephrolithiasis   pitituary tumor  1989   TONSILLECTOMY AND ADENOIDECTOMY  1968     FAMILY HISTORY: Family History  Problem Relation Age of Onset   Colon polyps Mother    Hypertension Mother        Living, 72   Dementia Mother    Breast cancer Sister    Diabetes Father        Living, 58   Heart disease Paternal Grandmother    Colon  cancer Neg Hx    Stomach cancer Neg Hx    Ulcerative colitis Neg Hx    Esophageal cancer Neg Hx    Rectal cancer Neg Hx      SOCIAL HISTORY: Social History   Socioeconomic History   Marital status: Widowed    Spouse name: Not on file   Number of children: 1   Years of education: Not on file   Highest education level: Not on file  Occupational History   Occupation: Herbalist: [employer]  Tobacco Use   Smoking status: Never   Smokeless tobacco: Never  Vaping Use   Vaping Use: Never used  Substance and Sexual Activity   Alcohol use: No   Drug use: No   Sexual activity: Never  Other Topics Concern   Not on file  Social History Narrative   Widowed 2018  one son.   She works as a Government social research officer.  Highest level of education:  Two years of college   Social Determinants of Health   Financial Resource Strain: Not on file  Food Insecurity: Not on file  Transportation Needs: Not on file  Physical Activity: Not on file  Stress: Not on file  Social Connections: Not on file  Intimate Partner Violence: Not on file     PHYSICAL EXAM  Vitals:   12/01/20 1536  BP: 121/69  Pulse: 65  Weight: 152 lb (68.9 kg)  Height: 5\' 8"  (1.727 m)   Body mass index is 23.11 kg/m.  Generalized: Well developed, in no acute distress  Cardiology: normal rate and rhythm, no murmur auscultated  Respiratory: clear to auscultation bilaterally    Neurological examination  Mentation: Alert oriented to time, place, history taking. Follows all commands speech and language fluent Cranial nerve II-XII: Pupils were equal round reactive to light. Extraocular movements were full, visual field were full on confrontational test. Facial sensation and strength were normal. Head turning and shoulder shrug  were normal and symmetric. Motor: The motor testing reveals 5 over 5 strength of all 4 extremities. Good symmetric motor tone is noted throughout.  Gait and station: Gait  is normal.   DIAGNOSTIC DATA (LABS, IMAGING, TESTING) - I reviewed patient records, labs, notes, testing and imaging myself where available.  Lab Results  Component Value Date   WBC 9.2 05/22/2020   HGB 15.1 (H) 05/22/2020   HCT 44.6 05/22/2020   MCV 94.3 05/22/2020   PLT 227 05/22/2020      Component Value Date/Time   NA 137 05/22/2020 1359   NA 144 01/22/2019 1225   K 3.9 05/22/2020 1359   CL 104 05/22/2020 1359   CO2 23 05/22/2020 1359   GLUCOSE 96 05/22/2020 1359   BUN 18 05/22/2020 1359   BUN 19 01/22/2019 1225   CREATININE 0.82 05/22/2020 1359   CALCIUM 9.2 05/22/2020 1359   PROT 7.6 05/22/2020 1359   ALBUMIN 4.2 05/22/2020 1359   AST 21 05/22/2020 1359   ALT 18 05/22/2020 1359   ALKPHOS 68 05/22/2020 1359   BILITOT 0.4 05/22/2020 1359   GFRNONAA >60 05/22/2020 1359   GFRAA 86 01/22/2019 1225   Lab Results  Component Value Date   CHOL (H) 09/23/2008    205        ATP III CLASSIFICATION:  <200     mg/dL   Desirable  200-239  mg/dL   Borderline High  >=240    mg/dL   High          HDL 60 09/23/2008   LDLCALC (H) 09/23/2008    138        Total Cholesterol/HDL:CHD Risk Coronary Heart Disease Risk Table                     Men   Women  1/2 Average Risk   3.4   3.3  Average Risk       5.0   4.4  2 X Average Risk   9.6   7.1  3 X Average Risk  23.4   11.0        Use the calculated Patient Ratio above and the CHD Risk Table to determine the patient's CHD Risk.        ATP III CLASSIFICATION (LDL):  <100     mg/dL   Optimal  100-129  mg/dL   Near or Above  Optimal  130-159  mg/dL   Borderline  160-189  mg/dL   High  >190     mg/dL   Very High   TRIG 35 09/23/2008   CHOLHDL 3.4 09/23/2008   No results found for: HGBA1C No results found for: VITAMINB12   No flowsheet data found.   No flowsheet data found.   ASSESSMENT AND PLAN  60 y.o. year old female  has a past medical history of Allergy, Anxiety, Chest pain of uncertain  etiology (30/10/2328), Chronic migraine (0/07/6224), Complication of anesthesia, Cushing's disease ([place]), Diverticulosis (2013), GERD (gastroesophageal reflux disease), Headache, Heart murmur, colonic polyps (2013), Hyperlipidemia, Hypertension (12/11/2018), IBS (irritable bowel syndrome), Internal and external hemorrhoids without complication (3335), Kidney stone on right side, Migraines, MVP (mitral valve prolapse), Osteopenia, Palpitations (12/11/2018), PONV (postoperative nausea and vomiting), Pre-procedure lab exam (12/11/2018), Proctalgia fugax, and Sinus arrhythmia (12/11/2018). here with    Migraine without aura and without status migrainosus, not intractable  Margaret Carney is doing well. She does not feel that she needs to start Emgality at this time and will continue sumatriptan as needed. May consider Nurtec or Ubrelvy as needed if she wishes. She will continue to focus on stress management and close follow up with PCP. She will follow up with [place] as needed.    No orders of the defined types were placed in this encounter.    No orders of the defined types were placed in this encounter.     Debbora Presto, MSN, FNP-C 12/01/2020, 4:12 PM  [facility] [address], [place], [place] [address] [phone]

## 2021-11-18 ENCOUNTER — Encounter (HOSPITAL_BASED_OUTPATIENT_CLINIC_OR_DEPARTMENT_OTHER): Payer: Self-pay | Admitting: Emergency Medicine

## 2021-11-18 ENCOUNTER — Emergency Department (HOSPITAL_BASED_OUTPATIENT_CLINIC_OR_DEPARTMENT_OTHER): Payer: 59

## 2021-11-18 ENCOUNTER — Other Ambulatory Visit: Payer: Self-pay

## 2021-11-18 ENCOUNTER — Emergency Department (HOSPITAL_BASED_OUTPATIENT_CLINIC_OR_DEPARTMENT_OTHER)
Admission: EM | Admit: 2021-11-18 | Discharge: 2021-11-18 | Disposition: A | Discharge status: Home / Self Care | Payer: 59 | Attending: Emergency Medicine | Admitting: Emergency Medicine

## 2021-11-18 DIAGNOSIS — H5789 Other specified disorders of eye and adnexa: Secondary | ICD-10-CM | POA: Insufficient documentation

## 2021-11-18 DIAGNOSIS — S161XXA Strain of muscle, fascia and tendon at neck level, initial encounter: Secondary | ICD-10-CM | POA: Insufficient documentation

## 2021-11-18 DIAGNOSIS — I10 Essential (primary) hypertension: Secondary | ICD-10-CM | POA: Insufficient documentation

## 2021-11-18 DIAGNOSIS — S199XXA Unspecified injury of neck, initial encounter: Secondary | ICD-10-CM | POA: Diagnosis present

## 2021-11-18 DIAGNOSIS — Y92410 Unspecified street and highway as the place of occurrence of the external cause: Secondary | ICD-10-CM | POA: Insufficient documentation

## 2021-11-18 DIAGNOSIS — H578A3 Foreign body sensation, bilateral eyes: Secondary | ICD-10-CM

## 2021-11-18 MED ORDER — KETOROLAC TROMETHAMINE 15 MG/ML IJ SOLN
15.0000 mg | Freq: Once | INTRAMUSCULAR | Status: AC
  Administered 2021-11-18: 15 mg via INTRAMUSCULAR
  Filled 2021-11-18: qty 1

## 2021-11-18 MED ORDER — TETRACAINE HCL 0.5 % OP SOLN
2.0000 [drp] | Freq: Once | OPHTHALMIC | Status: AC
  Administered 2021-11-18: 2 [drp] via OPHTHALMIC
  Filled 2021-11-18: qty 4

## 2021-11-18 MED ORDER — METHOCARBAMOL 500 MG PO TABS
500.0000 mg | ORAL_TABLET | Freq: Once | ORAL | Status: AC
  Administered 2021-11-18: 500 mg via ORAL
  Filled 2021-11-18: qty 1

## 2021-11-18 MED ORDER — NAPROXEN 375 MG PO TABS: 375.0000 mg | ORAL_TABLET | Freq: Two times a day (BID) | ORAL | 0 refills | Status: DC

## 2021-11-18 MED ORDER — FLUORESCEIN SODIUM 1 MG OP STRP
3.0000 | ORAL_STRIP | Freq: Once | OPHTHALMIC | Status: AC
  Administered 2021-11-18: 3 via OPHTHALMIC
  Filled 2021-11-18: qty 3

## 2021-11-18 MED ORDER — ERYTHROMYCIN 5 MG/GM OP OINT: TOPICAL_OINTMENT | OPHTHALMIC | 0 refills | Status: DC

## 2021-11-18 MED ORDER — METHOCARBAMOL 500 MG PO TABS: 500.0000 mg | ORAL_TABLET | Freq: Two times a day (BID) | ORAL | 0 refills | Status: DC
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
## 2021-12-28 ENCOUNTER — Other Ambulatory Visit: Payer: Self-pay | Admitting: Family Medicine

## 2021-12-28 DIAGNOSIS — S060X9A Concussion with loss of consciousness of unspecified duration, initial encounter: Secondary | ICD-10-CM
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
## 2022-01-04 ENCOUNTER — Ambulatory Visit (INDEPENDENT_AMBULATORY_CARE_PROVIDER_SITE_OTHER): Payer: 59 | Admitting: Sports Medicine

## 2022-01-04 ENCOUNTER — Telehealth: Payer: Self-pay | Admitting: Cardiology

## 2022-01-04 VITALS — HR 70 | Ht 68.0 in | Wt 160.0 lb

## 2022-01-04 DIAGNOSIS — G44319 Acute post-traumatic headache, not intractable: Secondary | ICD-10-CM | POA: Diagnosis not present

## 2022-01-04 DIAGNOSIS — S060X0A Concussion without loss of consciousness, initial encounter: Secondary | ICD-10-CM

## 2022-01-04 DIAGNOSIS — F411 Generalized anxiety disorder: Secondary | ICD-10-CM | POA: Diagnosis not present

## 2022-01-04 DIAGNOSIS — R270 Ataxia, unspecified: Secondary | ICD-10-CM
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
## 2022-01-04 NOTE — Patient Instructions (Addendum)
Good to see you Out of work 2 weeks  -  Relative mental and physical rest for 48 hours after concussive event - Recommend light aerobic activity while keeping symptoms less than 3/10 - Stop mental or physical activities that cause symptoms to worsen greater than 3/10, and wait 24 hours before attempting them again - Eliminate screen time as much as possible for first 48 hours after concussive event, then continue limited screen time (recommend less than 2 hours per day) Ask you therapist if they are doing vestibular therapy , if not call us and we will refer 2 week follow up

## 2022-01-07 NOTE — Telephone Encounter (Signed)
Patient has been schedule to see Isaac Laud next week 12/14

## 2022-01-13 ENCOUNTER — Ambulatory Visit: Payer: 59 | Attending: Physician Assistant | Admitting: Physician Assistant

## 2022-01-13 ENCOUNTER — Encounter: Payer: Self-pay | Admitting: Physician Assistant

## 2022-01-13 VITALS — BP 127/80 | HR 64 | Ht 67.5 in | Wt 153.8 lb

## 2022-01-13 DIAGNOSIS — R0600 Dyspnea, unspecified: Secondary | ICD-10-CM | POA: Diagnosis not present

## 2022-01-13 DIAGNOSIS — R0789 Other chest pain: Secondary | ICD-10-CM | POA: Diagnosis not present

## 2022-01-13 DIAGNOSIS — I10 Essential (primary) hypertension: Secondary | ICD-10-CM | POA: Diagnosis not present

## 2022-01-13 DIAGNOSIS — R002 Palpitations: Secondary | ICD-10-CM

## 2022-01-13 DIAGNOSIS — Q2112 Patent foramen ovale: Secondary | ICD-10-CM | POA: Diagnosis not present

## 2022-01-13 MED ORDER — METOPROLOL SUCCINATE ER 25 MG PO TB24: ORAL_TABLET | ORAL | 2 refills | Status: DC
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
## 2022-01-13 NOTE — Patient Instructions (Signed)
Medication Instructions:   RESTART Metoprolol Succinate 12.5 mg daily   *If you need a refill on your cardiac medications before your next appointment, please call your pharmacy*  Lab Work: NONE ordered at this time of appointment   If you have labs (blood work) drawn today and your tests are completely normal, you will receive your results only by: [place] (if you have MyChart) OR A paper copy in the mail If you have any lab test that is abnormal or we need to change your treatment, we will call you to review the results.  Testing/Procedures: Almyra Deforest, PA-C has requested that you have an echocardiogram. Echocardiography is a painless test that uses sound waves to create images of your heart. It provides your doctor with information about the size and shape of your heart and how well your heart's chambers and valves are working. This procedure takes approximately one hour. There are no restrictions for this procedure. Please do NOT wear cologne, perfume, aftershave, or lotions (deodorant is allowed). Please arrive 15 minutes prior to your appointment time.   Follow-Up: At [facility], you and your health needs are our priority.  As part of our continuing mission to provide you with exceptional heart care, we have created designated Provider Care Teams.  These Care Teams include your primary Cardiologist (physician) and Advanced Practice Providers (APPs -  Physician Assistants and Nurse Practitioners) who all work together to provide you with the care you need, when you need it.  We recommend signing up for the patient portal called "MyChart".  Sign up information is provided on this After Visit Summary.  MyChart is used to connect with patients for Virtual Visits (Telemedicine).  Patients are able to view lab/test results, encounter notes, upcoming appointments, etc.  Non-urgent messages can be sent to your provider as well.   To learn more about what you can do with  MyChart, go to [email or web address].    Your next appointment:   As previously scheduled   The format for your next appointment:   In Person  Provider:   Berniece Salines, DO     Other Instructions  Important Information About Sugar

## 2022-01-15 ENCOUNTER — Encounter: Payer: Self-pay | Admitting: Physician Assistant
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
## 2022-01-18 ENCOUNTER — Ambulatory Visit: Payer: 59 | Admitting: Sports Medicine

## 2022-01-18 VITALS — BP 110/80 | HR 67 | Ht 67.0 in | Wt 153.0 lb

## 2022-01-18 DIAGNOSIS — R270 Ataxia, unspecified: Secondary | ICD-10-CM | POA: Diagnosis not present

## 2022-01-18 DIAGNOSIS — S060X0A Concussion without loss of consciousness, initial encounter: Secondary | ICD-10-CM

## 2022-01-18 DIAGNOSIS — G44319 Acute post-traumatic headache, not intractable: Secondary | ICD-10-CM | POA: Diagnosis not present

## 2022-01-18 DIAGNOSIS — F411 Generalized anxiety disorder: Secondary | ICD-10-CM

## 2022-01-18 MED ORDER — AMITRIPTYLINE HCL 10 MG PO TABS: 10.0000 mg | ORAL_TABLET | Freq: Every day | ORAL | 0 refills | Status: DC

## 2022-01-18 NOTE — Patient Instructions (Addendum)
Good to see you  Start amitriptyline 10 mg nightly for 1 week and if no negative side effects will increase to 20 mg two tablets nightly  Work note provided  Continue vestibular PT  2 week follow up

## 2022-01-20 ENCOUNTER — Ambulatory Visit
Admission: RE | Admit: 2022-01-20 | Discharge: 2022-01-20 | Disposition: A | Discharge status: Home / Self Care | Payer: 59 | Source: Ambulatory Visit | Attending: Family Medicine | Admitting: Family Medicine

## 2022-01-20 DIAGNOSIS — S060X9A Concussion with loss of consciousness of unspecified duration, initial encounter: Secondary | ICD-10-CM

## 2022-01-27 NOTE — Addendum Note (Signed)
Addended by: Patria Mane A on: 01/27/2022 09:44 AM   Modules accepted: Orders
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
## 2022-02-01 ENCOUNTER — Ambulatory Visit: Payer: 59 | Admitting: Sports Medicine

## 2022-02-01 VITALS — BP 122/80 | Ht 67.0 in | Wt 159.0 lb

## 2022-02-01 DIAGNOSIS — R270 Ataxia, unspecified: Secondary | ICD-10-CM

## 2022-02-01 DIAGNOSIS — G44319 Acute post-traumatic headache, not intractable: Secondary | ICD-10-CM | POA: Diagnosis not present

## 2022-02-01 DIAGNOSIS — S060X0A Concussion without loss of consciousness, initial encounter: Secondary | ICD-10-CM

## 2022-02-01 DIAGNOSIS — F411 Generalized anxiety disorder: Secondary | ICD-10-CM | POA: Diagnosis not present

## 2022-02-01 MED ORDER — AMITRIPTYLINE HCL 10 MG PO TABS: 10.0000 mg | ORAL_TABLET | Freq: Every day | ORAL | 0 refills | Status: DC

## 2022-02-01 NOTE — Patient Instructions (Addendum)
Good to see you  Increase amitriptyline 30 mg night if you cannot handle this increase please go back to 20 mg  Work note  2 week follow up

## 2022-02-08 ENCOUNTER — Telehealth: Payer: Self-pay | Admitting: Sports Medicine

## 2022-02-08 ENCOUNTER — Other Ambulatory Visit (HOSPITAL_BASED_OUTPATIENT_CLINIC_OR_DEPARTMENT_OTHER): Payer: 59

## 2022-02-08 NOTE — Telephone Encounter (Signed)
Form received as well. (This may be all that needs to be completed instead of the letter).  In Dr Marisue Brooklyn box.

## 2022-02-08 NOTE — Telephone Encounter (Signed)
Patient called stating that her employer is giving her a hard time about time off of work. She said that she is thinking they are needing a more detailed note in regards to her time off until the 16th.   Please advise.   Fax # [phone] [employer]  [facility]

## 2022-02-09 ENCOUNTER — Ambulatory Visit: Payer: 59 | Admitting: Cardiology

## 2022-02-09 NOTE — Telephone Encounter (Signed)
Will fax paperwork once completed

## 2022-02-11 NOTE — Telephone Encounter (Signed)
Patient called to follow up to confirm that this was done. She would like to pick up a copy at her appointment on Tuesday.

## 2022-02-14 NOTE — Telephone Encounter (Signed)
Copt left up front for patient
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
## 2022-02-15 ENCOUNTER — Ambulatory Visit: Payer: 59 | Admitting: Sports Medicine

## 2022-02-15 VITALS — BP 110/80 | HR 69 | Ht 67.0 in | Wt 155.0 lb

## 2022-02-15 DIAGNOSIS — F411 Generalized anxiety disorder: Secondary | ICD-10-CM

## 2022-02-15 DIAGNOSIS — G44319 Acute post-traumatic headache, not intractable: Secondary | ICD-10-CM

## 2022-02-15 DIAGNOSIS — R270 Ataxia, unspecified: Secondary | ICD-10-CM

## 2022-02-15 DIAGNOSIS — S060X0A Concussion without loss of consciousness, initial encounter: Secondary | ICD-10-CM

## 2022-02-15 MED ORDER — AMITRIPTYLINE HCL 10 MG PO TABS: 10.0000 mg | ORAL_TABLET | Freq: Every day | ORAL | 0 refills | Status: DC

## 2022-02-15 NOTE — Patient Instructions (Addendum)
Good to see you  Work note  Increase to 40 mg a day for 1 week and then if tolerated increase to 50 mg a day  3 week follow up

## 2022-02-24 ENCOUNTER — Telehealth: Payer: Self-pay | Admitting: Sports Medicine

## 2022-02-24 ENCOUNTER — Other Ambulatory Visit: Payer: Self-pay | Admitting: Sports Medicine

## 2022-02-24 MED ORDER — AMITRIPTYLINE HCL 50 MG PO TABS: 50.0000 mg | ORAL_TABLET | Freq: Every evening | ORAL | 0 refills | Status: DC | PRN

## 2022-02-24 NOTE — Progress Notes (Unsigned)
Refill placed

## 2022-02-24 NOTE — Telephone Encounter (Signed)
Refill was placed for Amitriptyline '50mg'$ 

## 2022-02-24 NOTE — Telephone Encounter (Signed)
Patient called in reference to the amitriptyline (ELAVIL) 10 MG tablet. She has been increasing it and is almost to the 50 mg increase.  Because of the increase, she will be out after today.  Can a new script be sent in?

## 2022-02-26 ENCOUNTER — Emergency Department (HOSPITAL_COMMUNITY)
Admission: EM | Admit: 2022-02-26 | Discharge: 2022-02-26 | Disposition: A | Discharge status: Home / Self Care | Payer: 59 | Attending: Emergency Medicine | Admitting: Emergency Medicine

## 2022-02-26 ENCOUNTER — Emergency Department (HOSPITAL_COMMUNITY): Payer: 59

## 2022-02-26 ENCOUNTER — Encounter (HOSPITAL_COMMUNITY): Payer: Self-pay

## 2022-02-26 DIAGNOSIS — R42 Dizziness and giddiness: Secondary | ICD-10-CM | POA: Diagnosis present

## 2022-02-26 DIAGNOSIS — N3000 Acute cystitis without hematuria: Secondary | ICD-10-CM | POA: Insufficient documentation

## 2022-02-26 DIAGNOSIS — Z7982 Long term (current) use of aspirin: Secondary | ICD-10-CM | POA: Insufficient documentation

## 2022-02-26 LAB — COMPREHENSIVE METABOLIC PANEL
ALT: 49 U/L — ABNORMAL HIGH (ref 0–44)
AST: 30 U/L (ref 15–41)
Albumin: 4.2 g/dL (ref 3.5–5.0)
Alkaline Phosphatase: 70 U/L (ref 38–126)
Anion gap: 10 (ref 5–15)
BUN: 16 mg/dL (ref 8–23)
CO2: 25 mmol/L (ref 22–32)
Calcium: 9.7 mg/dL (ref 8.9–10.3)
Chloride: 103 mmol/L (ref 98–111)
Creatinine, Ser: 0.98 mg/dL (ref 0.44–1.00)
GFR, Estimated: >60 mL/min (ref >60)
Glucose, Bld: 90 mg/dL (ref 70–99)
Potassium: 4.6 mmol/L (ref 3.5–5.1)
Sodium: 138 mmol/L (ref 135–145)
Total Bilirubin: 0.7 mg/dL (ref 0.3–1.2)
Total Protein: 7.4 g/dL (ref 6.5–8.1)

## 2022-02-26 LAB — CBC WITH DIFFERENTIAL/PLATELET
Abs Immature Granulocytes: 0.02 10*3/uL (ref 0.00–0.07)
Basophils Absolute: 0 10*3/uL (ref 0.0–0.1)
Basophils Relative: 1 %
Eosinophils Absolute: 0.2 10*3/uL (ref 0.0–0.5)
Eosinophils Relative: 3 %
HCT: 45.1 % (ref 36.0–46.0)
Hemoglobin: 15.1 g/dL — ABNORMAL HIGH (ref 12.0–15.0)
Immature Granulocytes: 0 %
Lymphocytes Relative: 19 %
Lymphs Abs: 1 10*3/uL (ref 0.7–4.0)
MCH: 31.9 pg (ref 26.0–34.0)
MCHC: 33.5 g/dL (ref 30.0–36.0)
MCV: 95.1 fL (ref 80.0–100.0)
Monocytes Absolute: 0.4 10*3/uL (ref 0.1–1.0)
Monocytes Relative: 8 %
Neutro Abs: 3.8 10*3/uL (ref 1.7–7.7)
Neutrophils Relative %: 69 %
Platelets: 240 10*3/uL (ref 150–400)
RBC: 4.74 MIL/uL (ref 3.87–5.11)
RDW: 12.7 % (ref 11.5–15.5)
WBC: 5.5 10*3/uL (ref 4.0–10.5)
nRBC: 0 % (ref 0.0–0.2)

## 2022-02-26 LAB — URINALYSIS, ROUTINE W REFLEX MICROSCOPIC
Bilirubin Urine: NEGATIVE
Glucose, UA: NEGATIVE mg/dL
Hgb urine dipstick: NEGATIVE
Ketones, ur: NEGATIVE mg/dL
Nitrite: NEGATIVE
Protein, ur: NEGATIVE mg/dL
Specific Gravity, Urine: 1.021 (ref 1.005–1.030)
pH: 6 (ref 5.0–8.0)

## 2022-02-26 LAB — TROPONIN I (HIGH SENSITIVITY): Troponin I (High Sensitivity): <2 ng/L (ref <18)

## 2022-02-26 LAB — LIPASE, BLOOD: Lipase: 37 U/L (ref 11–51)

## 2022-02-26 LAB — CBG MONITORING, ED: Glucose-Capillary: 97 mg/dL (ref 70–99)

## 2022-02-26 MED ORDER — MECLIZINE HCL 25 MG PO TABS
25.0000 mg | ORAL_TABLET | Freq: Once | ORAL | Status: AC
  Administered 2022-02-26: 25 mg via ORAL
  Filled 2022-02-26: qty 1

## 2022-02-26 MED ORDER — CEPHALEXIN 500 MG PO CAPS: 500.0000 mg | ORAL_CAPSULE | Freq: Two times a day (BID) | ORAL | 0 refills | Status: AC

## 2022-02-26 MED ORDER — MECLIZINE HCL 25 MG PO TABS: 25.0000 mg | ORAL_TABLET | Freq: Three times a day (TID) | ORAL | 0 refills | Status: DC | PRN

## 2022-02-26 MED ORDER — IBUPROFEN 200 MG PO TABS
600.0000 mg | ORAL_TABLET | Freq: Once | ORAL | Status: AC
  Administered 2022-02-26: 600 mg via ORAL
  Filled 2022-02-26: qty 3

## 2022-02-26 MED ORDER — CEPHALEXIN 500 MG PO CAPS
500.0000 mg | ORAL_CAPSULE | Freq: Once | ORAL | Status: AC
  Administered 2022-02-26: 500 mg via ORAL
  Filled 2022-02-26: qty 1

## 2022-02-26 NOTE — Discharge Instructions (Addendum)
You were seen in the emergency department for dizziness, weakness, and urinary discomfort. A trial dose of meclizine was given to you in the ER which did appear to improve your balance difficulty slightly. Your urine in combination with symptoms is concerning for a UTI so a prescription for Keflex has been sent to your pharmacy which you should take as prescribed. Given that MRI was unable to be performed in the ER tonight, you should reach out to your primary care provider to discuss having this scheduled outpatient.
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
## 2022-02-26 NOTE — ED Triage Notes (Signed)
Pt arrived via POV, c/o near syncopal event at home.
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
## 2022-02-28 ENCOUNTER — Other Ambulatory Visit (HOSPITAL_BASED_OUTPATIENT_CLINIC_OR_DEPARTMENT_OTHER): Payer: 59
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
## 2022-03-08 ENCOUNTER — Ambulatory Visit (INDEPENDENT_AMBULATORY_CARE_PROVIDER_SITE_OTHER): Payer: 59 | Admitting: Sports Medicine

## 2022-03-08 VITALS — BP 122/82 | Ht 67.0 in | Wt 155.0 lb

## 2022-03-08 DIAGNOSIS — R270 Ataxia, unspecified: Secondary | ICD-10-CM | POA: Diagnosis not present

## 2022-03-08 DIAGNOSIS — H539 Unspecified visual disturbance: Secondary | ICD-10-CM

## 2022-03-08 DIAGNOSIS — F411 Generalized anxiety disorder: Secondary | ICD-10-CM

## 2022-03-08 DIAGNOSIS — R9089 Other abnormal findings on diagnostic imaging of central nervous system: Secondary | ICD-10-CM | POA: Diagnosis not present

## 2022-03-08 DIAGNOSIS — G44319 Acute post-traumatic headache, not intractable: Secondary | ICD-10-CM

## 2022-03-08 NOTE — Patient Instructions (Addendum)
Good to see you Follow up with neurology  Send [place] paper work whenever you get it  As needed follow up with [place]

## 2022-03-15 ENCOUNTER — Telehealth: Payer: Self-pay | Admitting: Sports Medicine

## 2022-03-15 NOTE — Telephone Encounter (Signed)
Margaret Carney called back she said that she spoke to [place] and the results have been released. She said that if we are not able to see them, then we may need to request them?  Please advise.

## 2022-03-15 NOTE — Telephone Encounter (Signed)
Spoke with Erlene Quan, patient's son, and discussed that I had not seen results for the 14-3-3 send out lab for CJD. Encouraged to follow up with Neurology establishing care visit on 03/18/22 and reach out to [place] if there is anything that we can do.

## 2022-03-15 NOTE — Telephone Encounter (Signed)
Daughter in law, Raquel Sarna called [phone]). She and pt son have POA.  They are having a difficult time in obtaining the CJD results from [facility]. The test was done 2-3 weeks ago but when they attempt to get results, they get the runaround. They have been told the results are back, but cannot get anyone to discuss them.  The [facility] has reached out, but also will not discuss the actual results. They have a Neuro appt 2/16, but would like to see if Dr. Glennon Mac can assist in obtaining results more quickly.

## 2022-03-15 NOTE — Telephone Encounter (Signed)
Called and spoke with patients son Erlene Quan and he vocalized understanding that we don't have much information at this time , and that neurology would be able to answer more of their questions at this time. He vocalized understanding and asked if they could have [facility] fax over results.  I relayed again that we would not be the best at reading those lab results, they would have more answer 2/16 when they meet with neurology.

## 2022-03-18 ENCOUNTER — Other Ambulatory Visit (HOSPITAL_COMMUNITY): Payer: 59

## 2022-03-21 ENCOUNTER — Encounter (HOSPITAL_COMMUNITY): Payer: Self-pay | Admitting: Physician Assistant

## 2022-03-24 ENCOUNTER — Ambulatory Visit: Payer: 59 | Admitting: Physician Assistant

## 2022-10-02 DEATH — deceased

## 2024-02-12 ENCOUNTER — Other Ambulatory Visit (HOSPITAL_COMMUNITY): Payer: Self-pay
